# Patient Record
Sex: Female | Born: 1985 | Race: White | Hispanic: Yes | Smoking: Never smoker
Health system: Southern US, Community
[De-identification: ages and names within clinical notes are randomized; demographics above are authoritative.]

## PROBLEM LIST (undated history)

## (undated) DIAGNOSIS — O24419 Gestational diabetes mellitus in pregnancy, unspecified control: Secondary | ICD-10-CM

## (undated) DIAGNOSIS — E039 Hypothyroidism, unspecified: Secondary | ICD-10-CM

## (undated) HISTORY — DX: Hypothyroidism, unspecified: E03.9

## (undated) HISTORY — PX: NO PAST SURGERIES: SHX2092

---
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
## 2004-04-06 ENCOUNTER — Ambulatory Visit: Payer: Self-pay | Admitting: Obstetrics and Gynecology

## 2004-04-08 ENCOUNTER — Inpatient Hospital Stay (HOSPITAL_COMMUNITY): Admission: AD | Admit: 2004-04-08 | Discharge: 2004-04-10 | Payer: Self-pay | Admitting: *Deleted

## 2004-04-08 ENCOUNTER — Ambulatory Visit: Payer: Self-pay | Admitting: Obstetrics and Gynecology

## 2004-08-24 ENCOUNTER — Inpatient Hospital Stay (HOSPITAL_COMMUNITY): Admission: AD | Admit: 2004-08-24 | Discharge: 2004-08-24 | Payer: Self-pay | Admitting: *Deleted

## 2004-08-26 ENCOUNTER — Inpatient Hospital Stay (HOSPITAL_COMMUNITY): Admission: AD | Admit: 2004-08-26 | Discharge: 2004-08-26 | Payer: Self-pay | Admitting: Obstetrics and Gynecology

## 2004-08-28 ENCOUNTER — Inpatient Hospital Stay (HOSPITAL_COMMUNITY): Admission: AD | Admit: 2004-08-28 | Discharge: 2004-08-28 | Payer: Self-pay | Admitting: Obstetrics & Gynecology

## 2006-01-14 ENCOUNTER — Encounter (INDEPENDENT_AMBULATORY_CARE_PROVIDER_SITE_OTHER): Payer: Self-pay | Admitting: *Deleted

## 2006-01-21 ENCOUNTER — Ambulatory Visit: Payer: Self-pay | Admitting: Family Medicine

## 2006-01-28 ENCOUNTER — Ambulatory Visit: Payer: Self-pay | Admitting: Family Medicine

## 2006-01-31 ENCOUNTER — Ambulatory Visit (HOSPITAL_COMMUNITY): Admission: RE | Admit: 2006-01-31 | Discharge: 2006-01-31 | Payer: Self-pay | Admitting: Family Medicine

## 2006-03-04 ENCOUNTER — Ambulatory Visit: Payer: Self-pay | Admitting: Family Medicine

## 2006-03-16 ENCOUNTER — Ambulatory Visit: Payer: Self-pay | Admitting: Family Medicine

## 2006-03-30 ENCOUNTER — Ambulatory Visit: Payer: Self-pay | Admitting: Family Medicine

## 2006-04-13 ENCOUNTER — Ambulatory Visit: Payer: Self-pay | Admitting: Family Medicine

## 2006-05-25 ENCOUNTER — Encounter: Payer: Self-pay | Admitting: Family Medicine

## 2006-05-25 ENCOUNTER — Ambulatory Visit: Payer: Self-pay | Admitting: Sports Medicine

## 2006-05-25 LAB — CONVERTED CEMR LAB: Chlamydia, DNA Probe: NEGATIVE

## 2006-06-03 ENCOUNTER — Ambulatory Visit: Payer: Self-pay | Admitting: Family Medicine

## 2006-06-10 ENCOUNTER — Ambulatory Visit: Payer: Self-pay | Admitting: Family Medicine

## 2006-06-17 ENCOUNTER — Ambulatory Visit: Payer: Self-pay | Admitting: Family Medicine

## 2006-06-17 ENCOUNTER — Ambulatory Visit: Payer: Self-pay | Admitting: Obstetrics and Gynecology

## 2006-06-17 ENCOUNTER — Inpatient Hospital Stay (HOSPITAL_COMMUNITY): Admission: AD | Admit: 2006-06-17 | Discharge: 2006-06-17 | Payer: Self-pay | Admitting: Obstetrics & Gynecology

## 2006-06-18 ENCOUNTER — Ambulatory Visit: Payer: Self-pay | Admitting: Gynecology

## 2006-06-18 ENCOUNTER — Inpatient Hospital Stay (HOSPITAL_COMMUNITY): Admission: AD | Admit: 2006-06-18 | Discharge: 2006-06-20 | Payer: Self-pay | Admitting: Obstetrics and Gynecology

## 2006-07-08 ENCOUNTER — Encounter (INDEPENDENT_AMBULATORY_CARE_PROVIDER_SITE_OTHER): Payer: Self-pay | Admitting: *Deleted

## 2007-05-07 IMAGING — US US OB COMP +14 WK
1 series · 13 of 28 positions shown · non-contrast
Comparison: none

CLINICAL DATA: Assess dates and fetal anatomy.

[Series 1: us ob comp +14 wk · 0.31mm/px · 81 acquisitions, 13 frames shown]
[im 3/81]
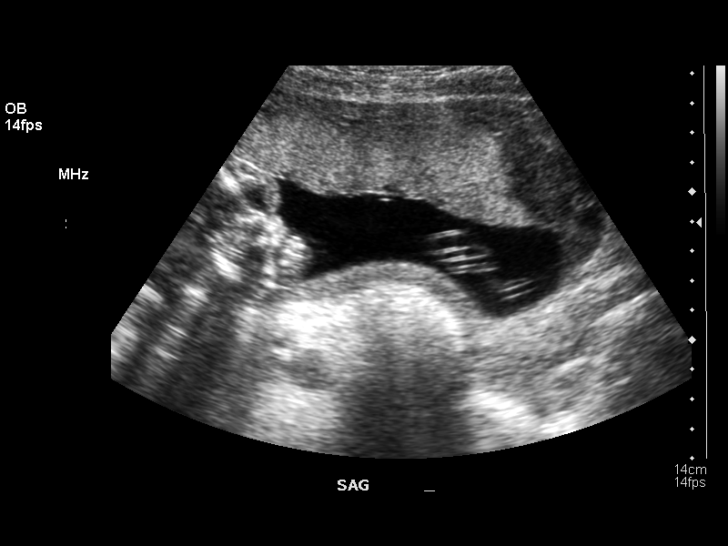
[im 9/81]
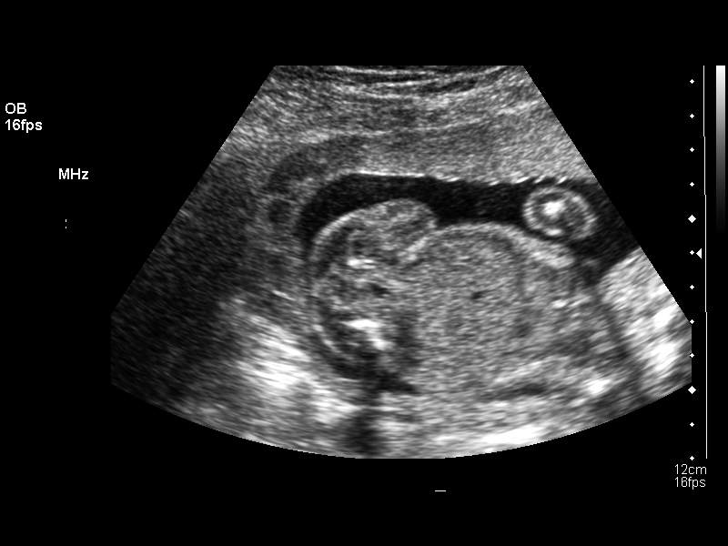
[im 15/81]
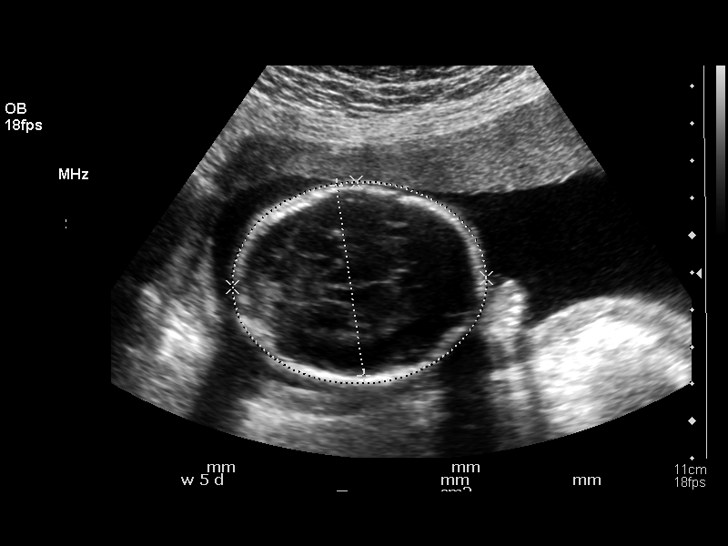
[im 21/81]
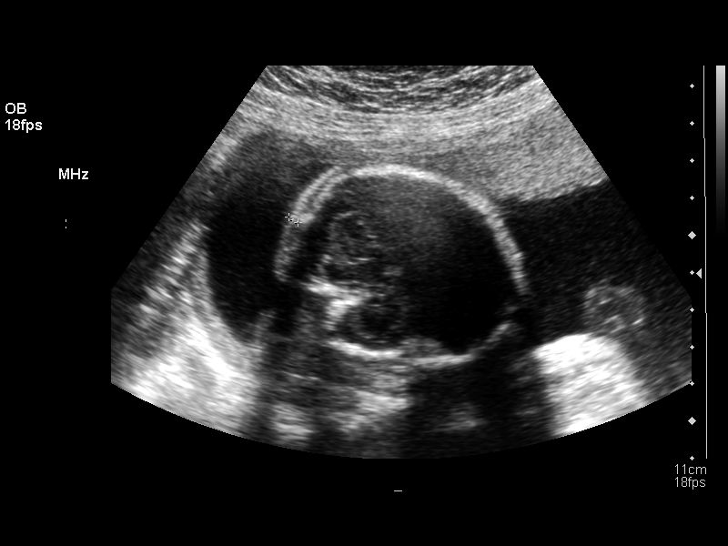
[im 27/81]
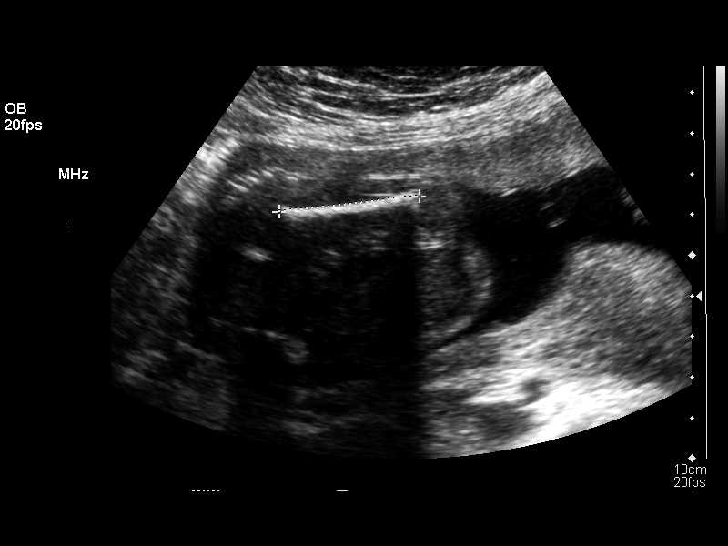
[im 33/81]
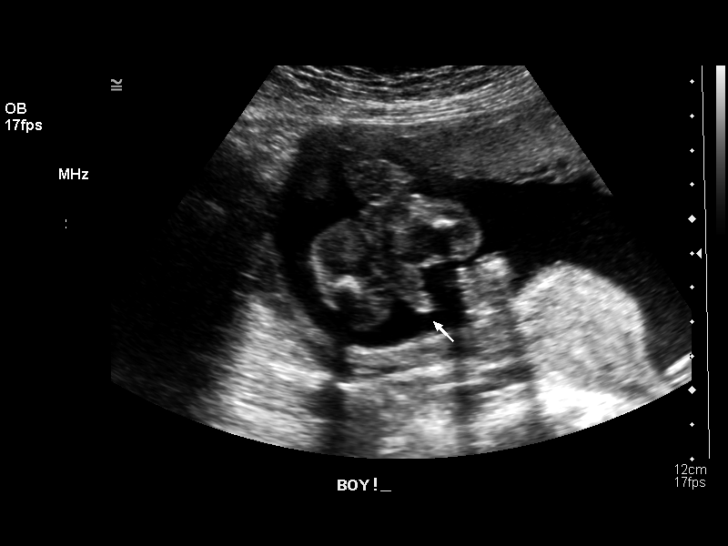
[im 42/81]
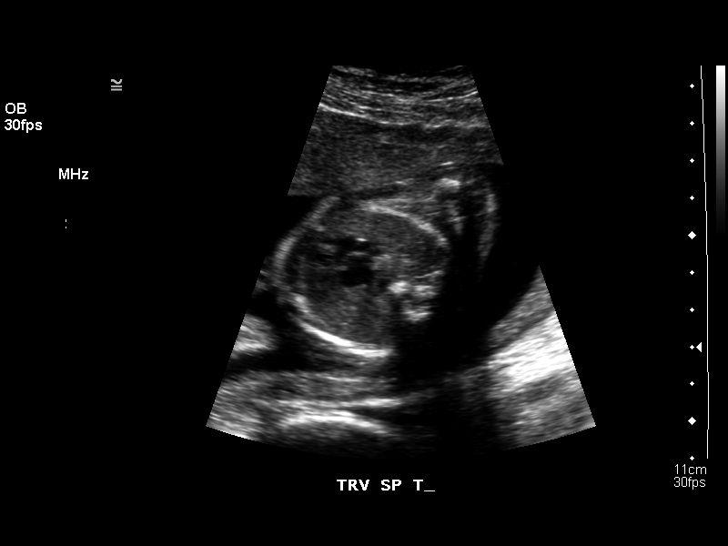
[im 48/81]
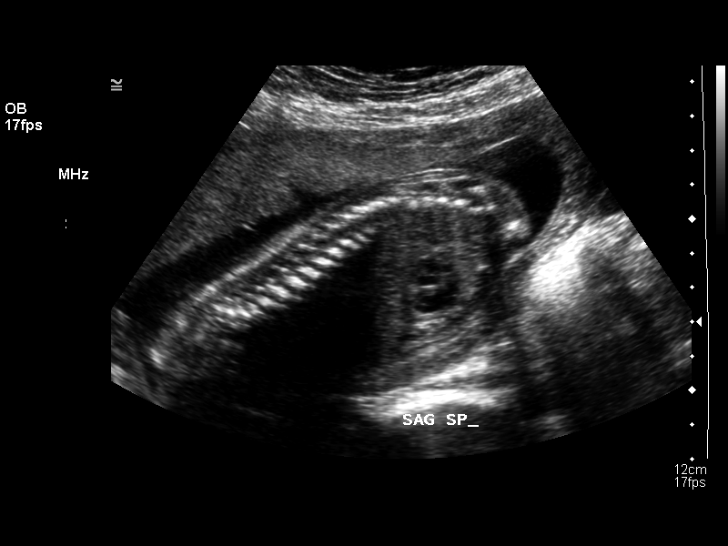
[im 54/81]
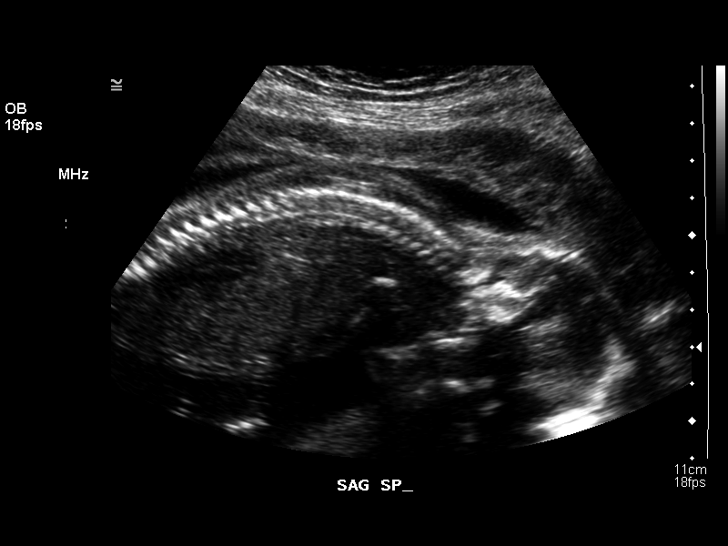
[im 60/81]
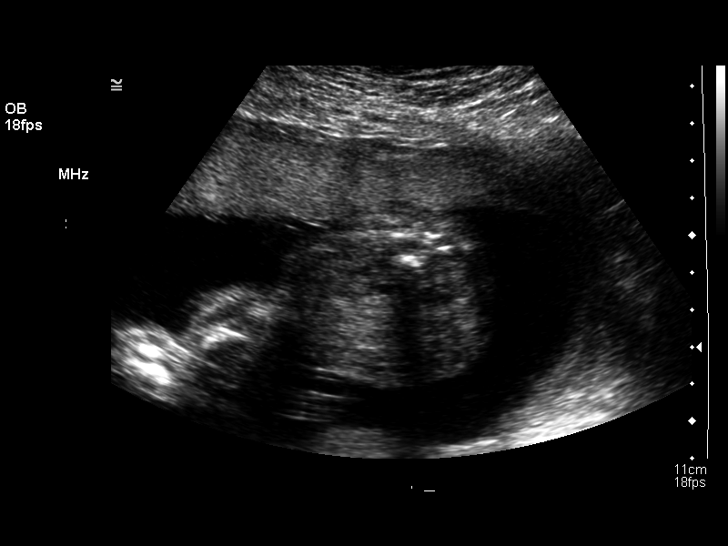
[im 66/81]
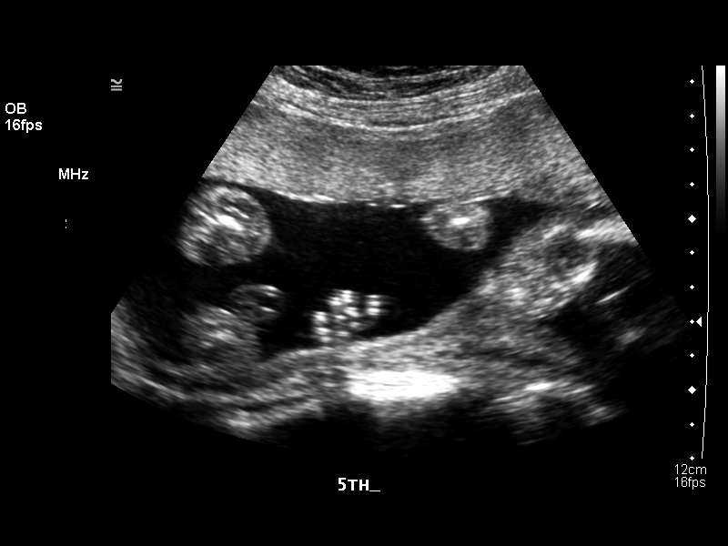
[im 72/81]
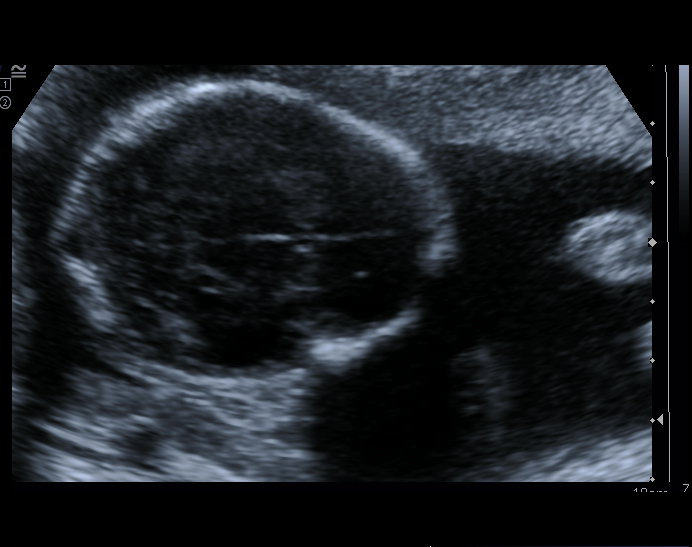
[im 78/81]
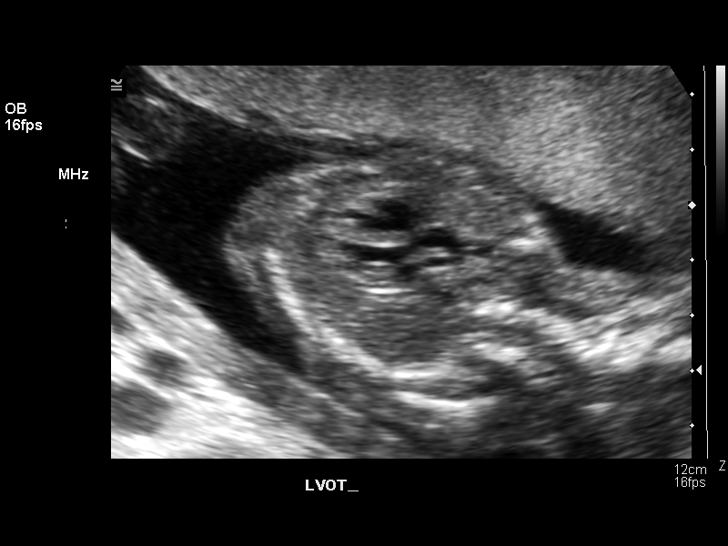

[13 of 28 positions shown; findings below may reference images not displayed]

OBSTETRICAL ULTRASOUND:
 Number of Fetuses:  1
 Heart Rate:  139 bpm
 Movement:  Yes
 Breathing:  No  
 Presentation:  Variable
 Placental Location:  Anterior
 Grade:  I
 Previa:   No
 Amniotic Fluid (Subjective):  Normal
 Amniotic Fluid (Objective):   5.4 cm vertical pocket

 FETAL BIOMETRY
 BPD:  5.2 cm  21 w 5 d 
 HC:  19.2 cm  21 w 3 d
 AC:  16.8 cm  21 w 6 d
 FL:  3.4 cm  20 w 5 d

 MEAN GA:  21 w 3 d   US EDC:  06/10/06  

 FETAL ANATOMY
 Lateral Ventricles:  Visualized 
 Thalami/CSP:  Visualized 
 Posterior Fossa:  Visualized 
 Nuchal Region:  NF= 4.0 mm  Visualized   
 Spine:  Visualized 
 4 Chamber Heart on Left:  Visualized 
 Stomach on Left:  Visualized 
 3 Vessel Cord:  Visualized 
 Cord Insertion site:  Visualized 
 Kidneys:  Visualized 
 Bladder:  Visualized   
 Extremities:  Visualized    

 ADDITIONAL ANATOMY VISUALIZED:  LVOT, RVOT, upper lip, orbits, profile, diaphragm, heel, 5th digit, ductal arch, aortic arch, and nasal bone.

 MATERNAL UTERINE AND ADNEXAL FINDINGS
 Cervix:  3.4 cm transabdominal.
IMPRESSION: 1.  Single intrauterine pregnancy demonstrating an estimated gestational age by ultrasound of 21 weeks 3 days.  This is 3 weeks 4 days ahead of expected estimated gestational age by LMP of 17 weeks 6 days.
 2.  No focal fetal or placental abnormalities are noted with a good anatomic exam possible. 
 3.  Subjectively and quantitatively normal amniotic fluid volume and normal cervical length.

## 2007-09-21 IMAGING — US US FETAL BPP W/O NONSTRESS
1 series · 14 of 18 positions shown · non-contrast
Comparison: none

OBSTETRICAL ULTRASOUND:

 This ultrasound exam was performed in the [HOSPITAL] Ultrasound Department.  The OB US report was generated in the AS system, and faxed to the ordering physician.  This report is also available in [REDACTED] PACS.

[Series 1: us fetal bpp w/o nonstress · non-contrast · 14 of 18 slices shown]
[im 1/18]
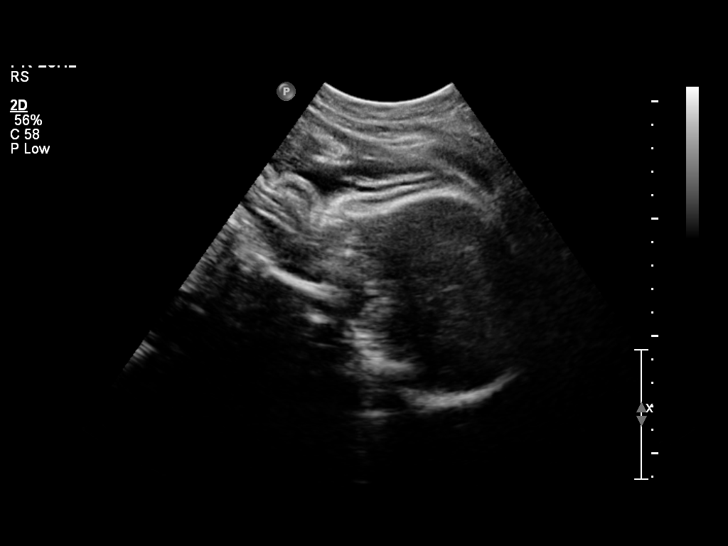
[im 2/18]
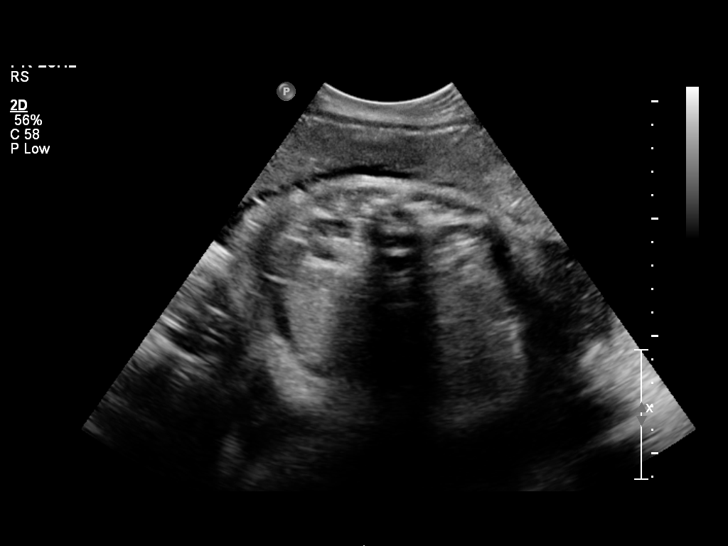
[im 4/18]
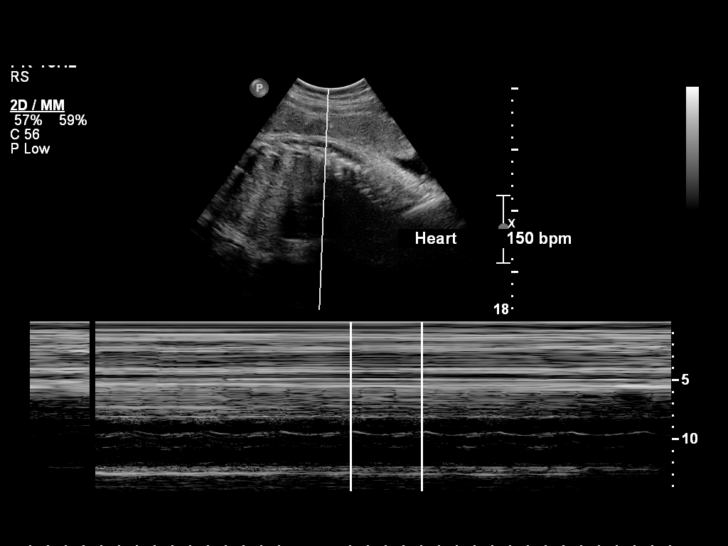
[im 5/18]
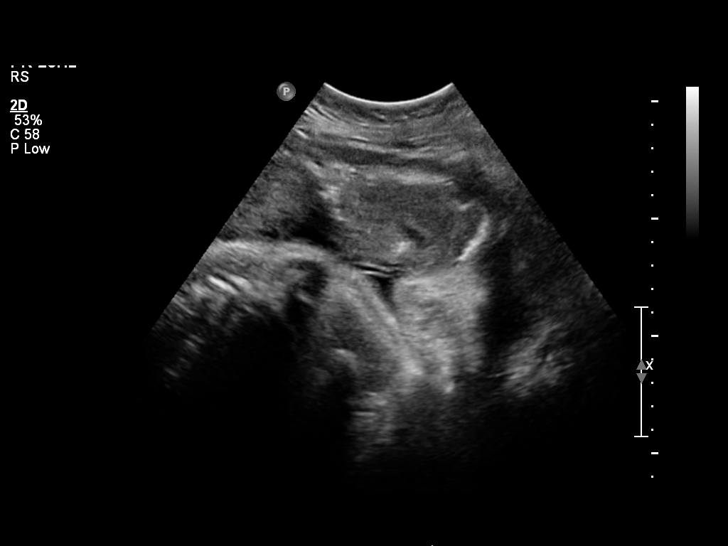
[im 6/18]
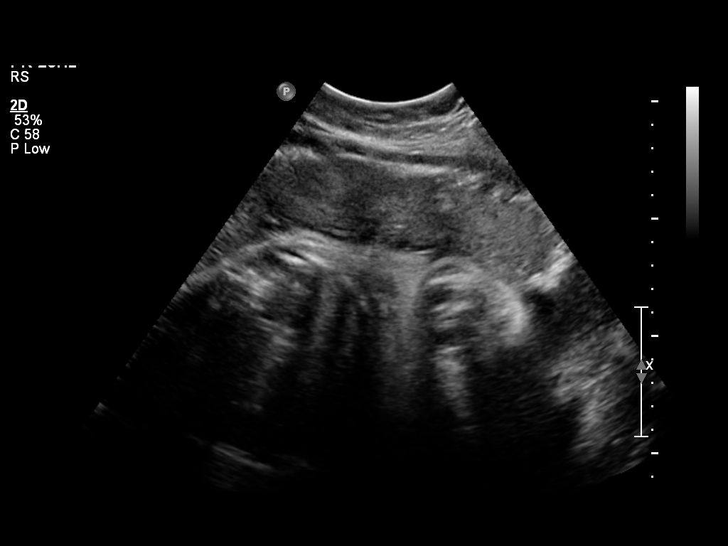
[im 8/18]
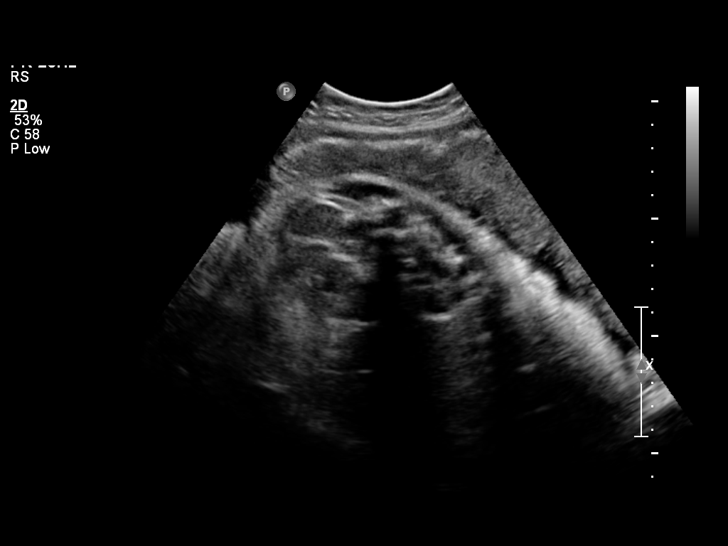
[im 9/18]
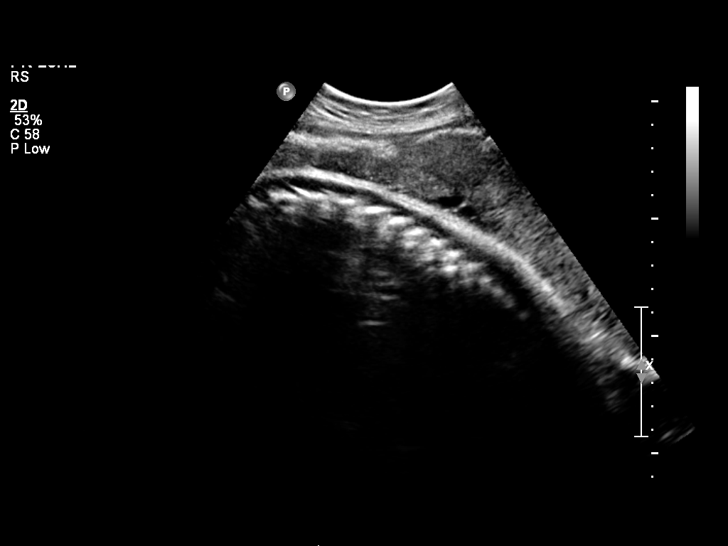
[im 10/18]
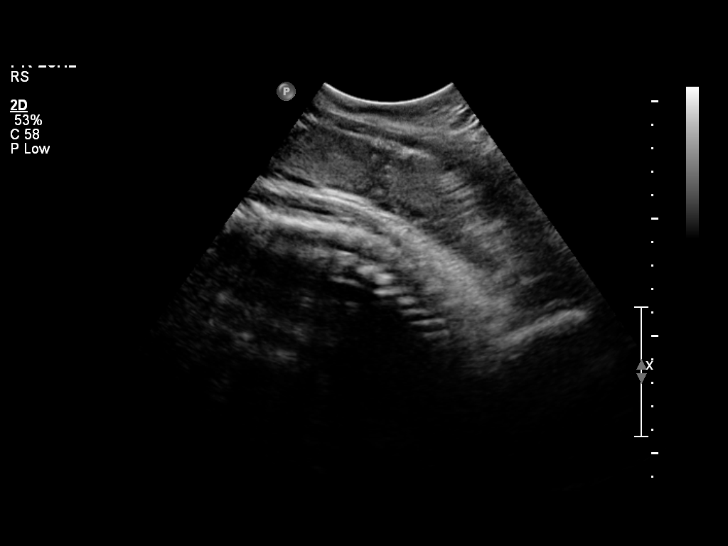
[im 11/18]
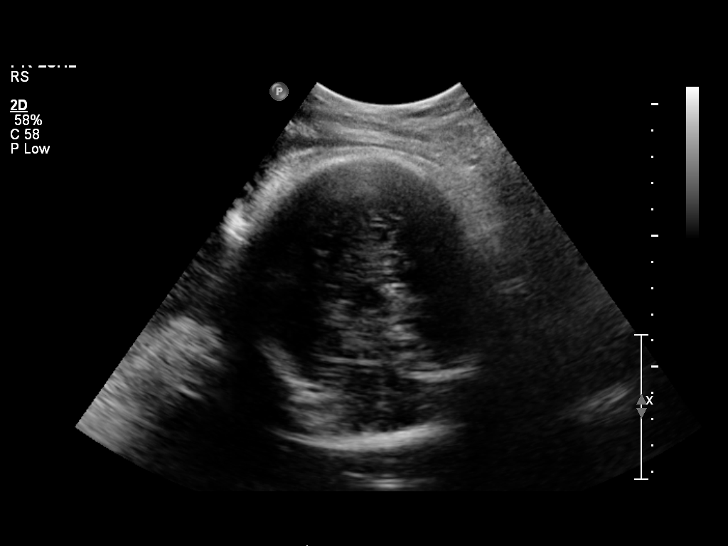
[im 13/18]
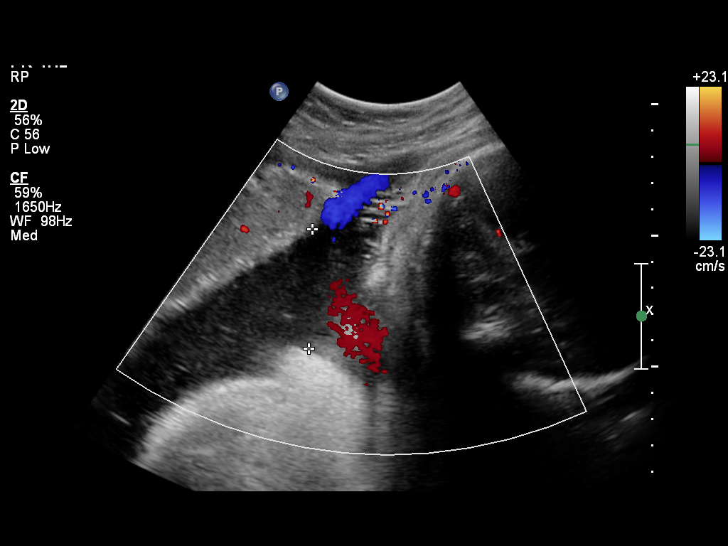
[im 14/18]
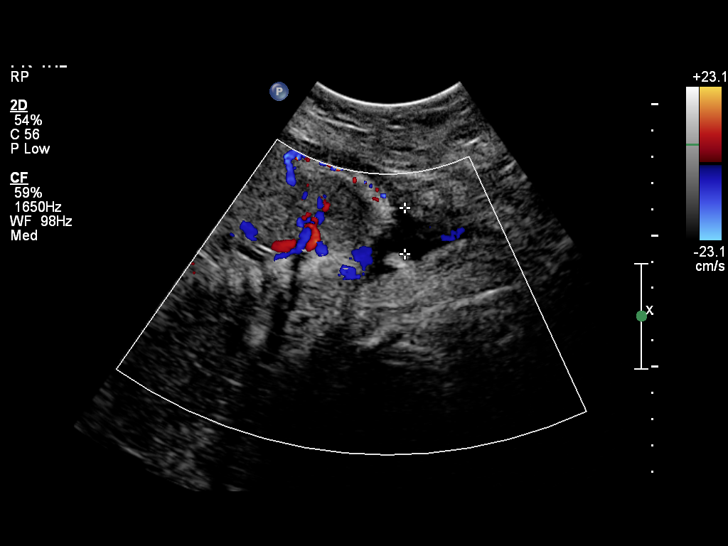
[im 15/18]
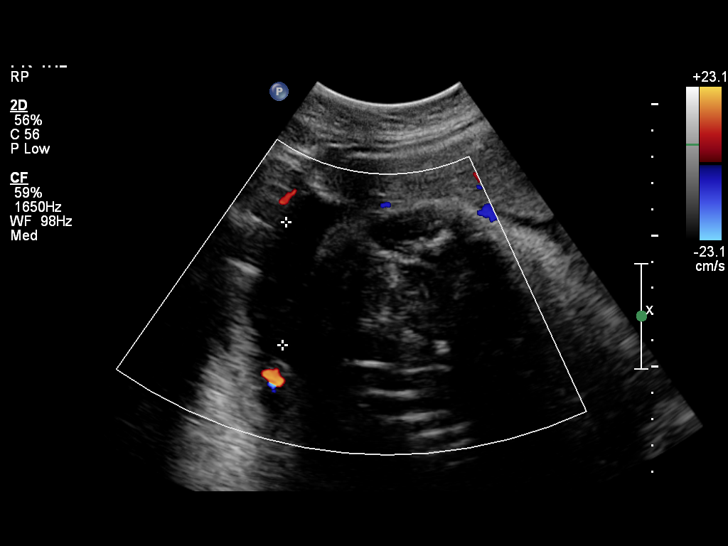
[im 17/18]
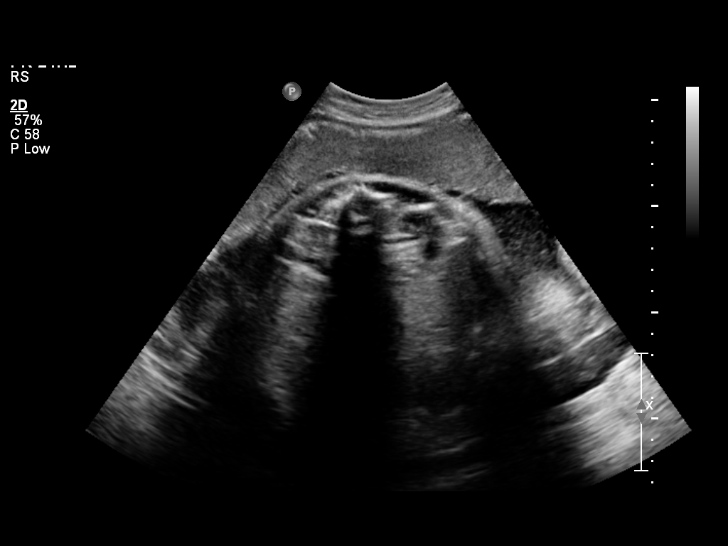
[im 18/18]
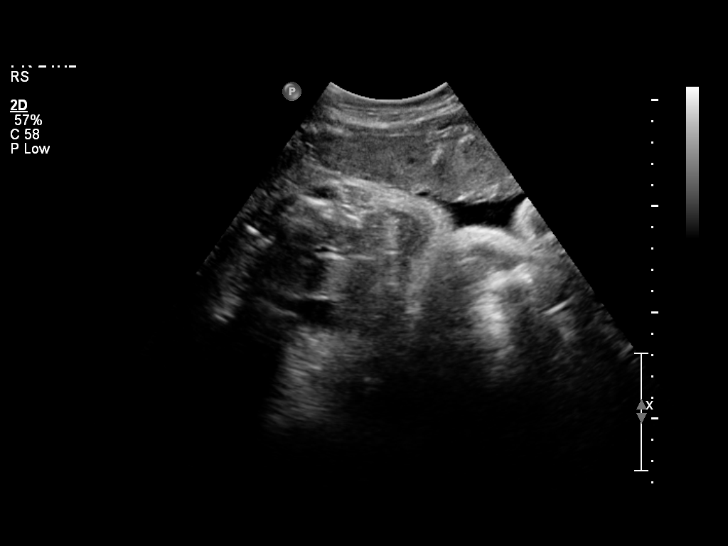

[14 of 18 positions shown; findings below may reference images not displayed]

IMPRESSION: See AS Obstetric US report.

## 2012-12-21 LAB — OB RESULTS CONSOLE ABO/RH: RH TYPE: POSITIVE

## 2012-12-21 LAB — OB RESULTS CONSOLE HEPATITIS B SURFACE ANTIGEN: HEP B S AG: NEGATIVE

## 2012-12-21 LAB — OB RESULTS CONSOLE ANTIBODY SCREEN: Antibody Screen: NEGATIVE

## 2012-12-21 LAB — OB RESULTS CONSOLE GC/CHLAMYDIA
CHLAMYDIA, DNA PROBE: NEGATIVE
Gonorrhea: NEGATIVE

## 2012-12-21 LAB — OB RESULTS CONSOLE RPR: RPR: NONREACTIVE

## 2012-12-21 LAB — PROCEDURE REPORT - SCANNED: PAP SMEAR: NEGATIVE

## 2012-12-21 LAB — OB RESULTS CONSOLE RUBELLA ANTIBODY, IGM: RUBELLA: IMMUNE

## 2012-12-21 LAB — OB RESULTS CONSOLE HIV ANTIBODY (ROUTINE TESTING): HIV: NONREACTIVE

## 2013-05-10 NOTE — L&D Delivery Note (Signed)
Delivery Note At 8:20 AM a viable female was delivered via  (Presentation: ;  ).  APGAR: , ; weight .   Placenta status: , .  Cord:  with the following complications: .  Cord pH: not done  Anesthesia: Epidural  Episiotomy:  Lacerations:  Suture Repair: 2.0 Est. Blood Loss (mL):   Mom to postpartum.  Baby to Couplet care / Skin to Skin.  Evander Macaraeg A 06/22/2013, 8:26 AM

## 2013-05-17 LAB — OB RESULTS CONSOLE GBS: GBS: NEGATIVE

## 2013-05-30 ENCOUNTER — Other Ambulatory Visit (HOSPITAL_COMMUNITY): Payer: Self-pay | Admitting: Obstetrics

## 2013-05-30 ENCOUNTER — Other Ambulatory Visit (HOSPITAL_COMMUNITY): Payer: Self-pay

## 2013-05-30 DIAGNOSIS — O4440 Low lying placenta NOS or without hemorrhage, unspecified trimester: Secondary | ICD-10-CM

## 2013-05-31 ENCOUNTER — Ambulatory Visit (HOSPITAL_COMMUNITY)
Admission: RE | Admit: 2013-05-31 | Discharge: 2013-05-31 | Disposition: A | Discharge status: Home / Self Care | Payer: Medicaid Other | Source: Ambulatory Visit | Attending: Obstetrics | Admitting: Obstetrics

## 2013-05-31 ENCOUNTER — Encounter (HOSPITAL_COMMUNITY): Payer: Self-pay

## 2013-05-31 DIAGNOSIS — O09299 Supervision of pregnancy with other poor reproductive or obstetric history, unspecified trimester: Secondary | ICD-10-CM | POA: Insufficient documentation

## 2013-05-31 DIAGNOSIS — O4440 Low lying placenta NOS or without hemorrhage, unspecified trimester: Secondary | ICD-10-CM

## 2013-05-31 DIAGNOSIS — O4400 Placenta previa specified as without hemorrhage, unspecified trimester: Secondary | ICD-10-CM | POA: Insufficient documentation

## 2013-05-31 DIAGNOSIS — Z363 Encounter for antenatal screening for malformations: Secondary | ICD-10-CM | POA: Insufficient documentation

## 2013-05-31 DIAGNOSIS — O358XX0 Maternal care for other (suspected) fetal abnormality and damage, not applicable or unspecified: Secondary | ICD-10-CM | POA: Insufficient documentation

## 2013-05-31 DIAGNOSIS — Z1389 Encounter for screening for other disorder: Secondary | ICD-10-CM | POA: Insufficient documentation

## 2013-06-22 ENCOUNTER — Encounter (HOSPITAL_COMMUNITY): Payer: Medicaid Other | Admitting: Anesthesiology

## 2013-06-22 ENCOUNTER — Inpatient Hospital Stay (HOSPITAL_COMMUNITY)
Admission: AD | Admit: 2013-06-22 | Discharge: 2013-06-23 | DRG: 775 | Disposition: A | Discharge status: Home / Self Care | Payer: Medicaid Other | Source: Ambulatory Visit | Attending: Obstetrics | Admitting: Obstetrics

## 2013-06-22 ENCOUNTER — Inpatient Hospital Stay (HOSPITAL_COMMUNITY): Payer: Medicaid Other | Admitting: Anesthesiology

## 2013-06-22 ENCOUNTER — Encounter (HOSPITAL_COMMUNITY): Payer: Self-pay

## 2013-06-22 DIAGNOSIS — IMO0001 Reserved for inherently not codable concepts without codable children: Secondary | ICD-10-CM

## 2013-06-22 LAB — CBC
HEMATOCRIT: 32.6 % — AB (ref 36.0–46.0)
Hemoglobin: 11 g/dL — ABNORMAL LOW (ref 12.0–15.0)
MCH: 26.8 pg (ref 26.0–34.0)
MCHC: 33.7 g/dL (ref 30.0–36.0)
MCV: 79.5 fL (ref 78.0–100.0)
PLATELETS: 216 10*3/uL (ref 150–400)
RBC: 4.1 MIL/uL (ref 3.87–5.11)
RDW: 14.1 % (ref 11.5–15.5)
WBC: 10 10*3/uL (ref 4.0–10.5)

## 2013-06-22 LAB — RPR: RPR: NONREACTIVE

## 2013-06-22 LAB — TYPE AND SCREEN
ABO/RH(D): O POS
Antibody Screen: NEGATIVE

## 2013-06-22 LAB — ABO/RH: ABO/RH(D): O POS

## 2013-06-22 MED ORDER — ONDANSETRON HCL 4 MG PO TABS: 4.0000 mg | ORAL_TABLET | ORAL | Status: DC | PRN

## 2013-06-22 MED ORDER — FERROUS SULFATE 325 (65 FE) MG PO TABS
325.0000 mg | ORAL_TABLET | Freq: Two times a day (BID) | ORAL | Status: DC
  Administered 2013-06-22 – 2013-06-23 (×3): 325 mg via ORAL
  Filled 2013-06-22 (×3): qty 1

## 2013-06-22 MED ORDER — SENNOSIDES-DOCUSATE SODIUM 8.6-50 MG PO TABS
2.0000 | ORAL_TABLET | ORAL | Status: DC
  Administered 2013-06-22: 2 via ORAL
  Filled 2013-06-22: qty 2

## 2013-06-22 MED ORDER — BENZOCAINE-MENTHOL 20-0.5 % EX AERO
1.0000 "application " | INHALATION_SPRAY | CUTANEOUS | Status: DC | PRN
  Administered 2013-06-22: 1 via TOPICAL
  Filled 2013-06-22: qty 56

## 2013-06-22 MED ORDER — ONDANSETRON HCL 4 MG/2ML IJ SOLN
4.0000 mg | Freq: Four times a day (QID) | INTRAMUSCULAR | Status: DC | PRN
  Filled 2013-06-22: qty 2

## 2013-06-22 MED ORDER — OXYCODONE-ACETAMINOPHEN 5-325 MG PO TABS: 1.0000 | ORAL_TABLET | ORAL | Status: DC | PRN

## 2013-06-22 MED ORDER — SODIUM BICARBONATE 8.4 % IV SOLN
INTRAVENOUS | Status: DC | PRN
  Administered 2013-06-22: 5 mL via EPIDURAL

## 2013-06-22 MED ORDER — ONDANSETRON HCL 4 MG/2ML IJ SOLN: 4.0000 mg | INTRAMUSCULAR | Status: DC | PRN

## 2013-06-22 MED ORDER — DIBUCAINE 1 % RE OINT: 1.0000 "application " | TOPICAL_OINTMENT | RECTAL | Status: DC | PRN

## 2013-06-22 MED ORDER — ZOLPIDEM TARTRATE 5 MG PO TABS: 5.0000 mg | ORAL_TABLET | Freq: Every evening | ORAL | Status: DC | PRN

## 2013-06-22 MED ORDER — PRENATAL MULTIVITAMIN CH
1.0000 | ORAL_TABLET | Freq: Every day | ORAL | Status: DC
  Administered 2013-06-22 – 2013-06-23 (×2): 1 via ORAL
  Filled 2013-06-22 (×2): qty 1

## 2013-06-22 MED ORDER — LANOLIN HYDROUS EX OINT: TOPICAL_OINTMENT | CUTANEOUS | Status: DC | PRN

## 2013-06-22 MED ORDER — INFLUENZA VAC SPLIT QUAD 0.5 ML IM SUSP
0.5000 mL | INTRAMUSCULAR | Status: AC
  Administered 2013-06-23: 0.5 mL via INTRAMUSCULAR

## 2013-06-22 MED ORDER — PRENATAL 27-0.8 MG PO TABS: 1.0000 | ORAL_TABLET | Freq: Every day | ORAL | Status: DC

## 2013-06-22 MED ORDER — TETANUS-DIPHTH-ACELL PERTUSSIS 5-2.5-18.5 LF-MCG/0.5 IM SUSP
0.5000 mL | Freq: Once | INTRAMUSCULAR | Status: AC
  Administered 2013-06-22: 0.5 mL via INTRAMUSCULAR
  Filled 2013-06-22: qty 0.5

## 2013-06-22 MED ORDER — PHENYLEPHRINE 40 MCG/ML (10ML) SYRINGE FOR IV PUSH (FOR BLOOD PRESSURE SUPPORT)
80.0000 ug | PREFILLED_SYRINGE | INTRAVENOUS | Status: DC | PRN
Start: 2013-06-22 — End: 2013-06-22
  Filled 2013-06-22: qty 10

## 2013-06-22 MED ORDER — EPHEDRINE 5 MG/ML INJ
10.0000 mg | INTRAVENOUS | Status: DC | PRN
  Administered 2013-06-22: 10 mg via INTRAVENOUS

## 2013-06-22 MED ORDER — LIDOCAINE HCL (PF) 1 % IJ SOLN
30.0000 mL | INTRAMUSCULAR | Status: DC | PRN
  Filled 2013-06-22: qty 30

## 2013-06-22 MED ORDER — METHYLERGONOVINE MALEATE 0.2 MG/ML IJ SOLN
INTRAMUSCULAR | Status: AC
  Filled 2013-06-22: qty 1

## 2013-06-22 MED ORDER — EPHEDRINE 5 MG/ML INJ
10.0000 mg | INTRAVENOUS | Status: DC | PRN
  Filled 2013-06-22: qty 4

## 2013-06-22 MED ORDER — OXYTOCIN 40 UNITS IN LACTATED RINGERS INFUSION - SIMPLE MED
62.5000 mL/h | INTRAVENOUS | Status: DC
  Administered 2013-06-22: 999 mL/h via INTRAVENOUS
  Filled 2013-06-22: qty 1000

## 2013-06-22 MED ORDER — LACTATED RINGERS IV SOLN: 500.0000 mL | Freq: Once | INTRAVENOUS | Status: DC

## 2013-06-22 MED ORDER — SIMETHICONE 80 MG PO CHEW: 80.0000 mg | CHEWABLE_TABLET | ORAL | Status: DC | PRN

## 2013-06-22 MED ORDER — DIPHENHYDRAMINE HCL 50 MG/ML IJ SOLN: 12.5000 mg | INTRAMUSCULAR | Status: DC | PRN

## 2013-06-22 MED ORDER — DIPHENHYDRAMINE HCL 25 MG PO CAPS: 25.0000 mg | ORAL_CAPSULE | Freq: Four times a day (QID) | ORAL | Status: DC | PRN

## 2013-06-22 MED ORDER — PHENYLEPHRINE 40 MCG/ML (10ML) SYRINGE FOR IV PUSH (FOR BLOOD PRESSURE SUPPORT): 80.0000 ug | PREFILLED_SYRINGE | INTRAVENOUS | Status: DC | PRN

## 2013-06-22 MED ORDER — METHYLERGONOVINE MALEATE 0.2 MG/ML IJ SOLN: 0.2000 mg | Freq: Once | INTRAMUSCULAR | Status: DC

## 2013-06-22 MED ORDER — OXYTOCIN BOLUS FROM INFUSION
500.0000 mL | INTRAVENOUS | Status: DC
Start: 2013-06-22 — End: 2013-06-22
  Administered 2013-06-22: 500 mL via INTRAVENOUS

## 2013-06-22 MED ORDER — LACTATED RINGERS IV SOLN
500.0000 mL | INTRAVENOUS | Status: DC | PRN
  Administered 2013-06-22: 300 mL via INTRAVENOUS

## 2013-06-22 MED ORDER — FLEET ENEMA 7-19 GM/118ML RE ENEM: 1.0000 | ENEMA | RECTAL | Status: DC | PRN

## 2013-06-22 MED ORDER — IBUPROFEN 600 MG PO TABS
600.0000 mg | ORAL_TABLET | Freq: Four times a day (QID) | ORAL | Status: DC
Start: 2013-06-22 — End: 2013-06-23
  Administered 2013-06-22 – 2013-06-23 (×4): 600 mg via ORAL
  Filled 2013-06-22 (×6): qty 1

## 2013-06-22 MED ORDER — IBUPROFEN 600 MG PO TABS: 600.0000 mg | ORAL_TABLET | Freq: Four times a day (QID) | ORAL | Status: DC | PRN

## 2013-06-22 MED ORDER — CITRIC ACID-SODIUM CITRATE 334-500 MG/5ML PO SOLN: 30.0000 mL | ORAL | Status: DC | PRN

## 2013-06-22 MED ORDER — ONDANSETRON HCL 4 MG/2ML IJ SOLN: 4.0000 mg | Freq: Four times a day (QID) | INTRAMUSCULAR | Status: DC | PRN

## 2013-06-22 MED ORDER — WITCH HAZEL-GLYCERIN EX PADS: 1.0000 "application " | MEDICATED_PAD | CUTANEOUS | Status: DC | PRN

## 2013-06-22 MED ORDER — FENTANYL 2.5 MCG/ML BUPIVACAINE 1/10 % EPIDURAL INFUSION (WH - ANES)
14.0000 mL/h | INTRAMUSCULAR | Status: DC | PRN
  Administered 2013-06-22: 14 mL/h via EPIDURAL
  Filled 2013-06-22: qty 125

## 2013-06-22 MED ORDER — ACETAMINOPHEN 325 MG PO TABS: 650.0000 mg | ORAL_TABLET | ORAL | Status: DC | PRN

## 2013-06-22 MED ORDER — LACTATED RINGERS IV SOLN
INTRAVENOUS | Status: DC
  Administered 2013-06-22 (×2): 125 mL/h via INTRAVENOUS

## 2013-06-22 MED ORDER — METHYLERGONOVINE MALEATE 0.2 MG PO TABS
0.2000 mg | ORAL_TABLET | Freq: Four times a day (QID) | ORAL | Status: DC
  Administered 2013-06-22 – 2013-06-23 (×5): 0.2 mg via ORAL
  Filled 2013-06-22 (×5): qty 1

## 2013-06-22 NOTE — Anesthesia Postprocedure Evaluation (Signed)
  Anesthesia Post-op Note  Anesthesia Post Note  Anesthesia Post Note  Patient: Debra Eaton  Procedure(s) Performed: * No procedures listed *  Anesthesia type: Epidural  Patient location: Mother/Baby  Post pain: Pain level controlled  Post assessment: Post-op Vital signs reviewed  Last Vitals:  Filed Vitals:   06/22/13 1210  BP: 108/66  Pulse: 74  Temp: 36.6 C  Resp: 18    Post vital signs: Reviewed  Level of consciousness:alert  Complications: No apparent anesthesia complications

## 2013-06-22 NOTE — MAU Note (Signed)
Pt c/o uc q 5-10 mins. Denies LOF or vag bleeding. +FM

## 2013-06-22 NOTE — Progress Notes (Signed)
E Royal interpreter here for introduction. Pt and sister without complaints

## 2013-06-22 NOTE — H&P (Signed)
This is Dr. Francoise CeoBernard Ardie Mclennan dictating the history and physical on  Debra Eaton  she's a 28 year old gravida 5 para 3013 at 40 weeks and 5 days EDC 28 negative GBS admitted in labor she is now 8 cm 100% vertex minus station amniotomy performed the fluids clear Past medical history negative Past surgical history negative Social history noncontributory and you System review negative physical exam well-developed female in no distress HEENT negative lungs clear to P&A heart regular rhythm no murmurs no gallops Breasts negative Abdomen term Pelvic as described above Extremities negative

## 2013-06-22 NOTE — Plan of Care (Signed)
Problem: Phase I Progression Outcomes Goal: Initial discharge plan identified Outcome: Completed/Met Date Met:  06/22/13 Mom requested early discharge after 24 hrs.

## 2013-06-22 NOTE — Anesthesia Procedure Notes (Signed)

## 2013-06-22 NOTE — Progress Notes (Signed)
UR completed 

## 2013-06-22 NOTE — Lactation Note (Signed)
This note was copied from the chart of Girl Winna Bernal-Lagarda. Lactation Consultation Note Initial consult:  Mother speaks english but primary language is spanish, provided [employer] lacation brochure.  Mother states breastfeeding going well and has been taught hand expression and has colostrum.  Encouraged mother to call if assistance is needed.  Reviewed basics, feeding 8-12 times a day, supply and demand and lactation services.   Patient Name: Girl Caren Macadamlejandra Bernal-Lagarda [id number]'s Date: 06/22/2013 Reason for consult: Initial assessment   Maternal Data Has patient been taught Hand Expression?: Yes Does the patient have breastfeeding experience prior to this delivery?: Yes  Feeding Feeding Type: Breast Fed Length of feed: 40 min  LATCH Score/Interventions Latch: Repeated attempts needed to sustain latch, nipple held in mouth throughout feeding, stimulation needed to elicit sucking reflex. Intervention(s): Waking techniques;Teach feeding cues;Skin to skin Intervention(s): Adjust position;Assist with latch;Breast massage;Breast compression  Audible Swallowing: None Intervention(s): Skin to skin;Hand expression  Type of Nipple: Everted at rest and after stimulation  Comfort (Breast/Nipple): Soft / non-tender     Hold (Positioning): Assistance needed to correctly position infant at breast and maintain latch.  LATCH Score: 6  Lactation Tools Discussed/Used Date initiated:: 06/23/13   Consult Status Consult Status: Follow-up Date: 06/23/13 Follow-up type: In-patient    Dahlia ByesBerkelhammer, [facility] Center[facility] 06/22/2013, 10:31 PM

## 2013-06-22 NOTE — Anesthesia Preprocedure Evaluation (Signed)

## 2013-06-23 LAB — CBC
HCT: 31.3 % — ABNORMAL LOW (ref 36.0–46.0)
Hemoglobin: 10.5 g/dL — ABNORMAL LOW (ref 12.0–15.0)
MCH: 26.9 pg (ref 26.0–34.0)
MCHC: 33.5 g/dL (ref 30.0–36.0)
MCV: 80.1 fL (ref 78.0–100.0)
Platelets: 198 10*3/uL (ref 150–400)
RBC: 3.91 MIL/uL (ref 3.87–5.11)
RDW: 14.2 % (ref 11.5–15.5)
WBC: 11.3 10*3/uL — AB (ref 4.0–10.5)

## 2013-06-23 NOTE — Lactation Note (Signed)
This note was copied from the chart of Debra Jay Eaton. Lactation Consultation Note  Patient Name: Debra Eaton ZOXWR'UToday's Date: 06/23/2013 Reason for consult: Follow-up assessment Per mom nipples are sore , LC assessed , pinky red , Reviewed basics - breast massage , hand express,  Pre- pump if needed to make the nipple and areola more elastic for a deeper latch . Baby hungry , Lc assisted with latch  And worked on depth , using the breast compression technique and per mom more comfortable.  Instructed on breast shells , comfort gels , and hand pump, sore nipple and engorgement prevent and tx. Mom aware of the [facility] and [facility].     Maternal Data Formula Feeding for Exclusion: Yes Reason for exclusion: Mother's choice to formula and breast feed on admission Has patient been taught Hand Expression?: Yes  Feeding Feeding Type: Breast Fed Length of feed:  (has been feeding 15 mins and still latched @0945 )  LATCH Score/Interventions Latch: Grasps breast easily, tongue down, lips flanged, rhythmical sucking. Intervention(s): Skin to skin;Teach feeding cues;Waking techniques Intervention(s): Assist with latch;Adjust position;Breast massage;Breast compression  Audible Swallowing: Spontaneous and intermittent  Type of Nipple: Everted at rest and after stimulation  Comfort (Breast/Nipple): Filling, red/small blisters or bruises, mild/mod discomfort  Problem noted: Filling  Hold (Positioning): Assistance needed to correctly position infant at breast and maintain latch. Intervention(s): Breastfeeding basics reviewed;Support Pillows;Position options;Skin to skin  LATCH Score: 8  Lactation Tools Discussed/Used Tools: Pump;Comfort gels;Shells Breast pump type: Manual WIC Program: Yes Pump Review: Setup, frequency, and cleaning;Milk Storage Initiated by:: MAI  Date initiated:: 06/23/13   Consult Status Consult Status: Complete    Kathrin Greathouseorio,  Maxine Huynh Ann 06/23/2013, 9:48 AM

## 2013-06-23 NOTE — Discharge Summary (Signed)
Obstetric Discharge Summary Reason for Admission: onset of labor Prenatal Procedures: none Intrapartum Procedures: spontaneous vaginal delivery Postpartum Procedures: none Complications-Operative and Postpartum: none Hemoglobin  Date Value Ref Range Status  06/22/2013 11.0* 12.0 - 15.0 g/dL Final     HCT  Date Value Ref Range Status  06/22/2013 32.6* 36.0 - 46.0 % Final    Physical Exam:  General: alert Lochia: appropriate Uterine Fundus: firm Incision: healing well DVT Evaluation: No evidence of DVT seen on physical exam.  Discharge Diagnoses: Term Pregnancy-delivered  Discharge Information: Date: 06/23/2013 Activity: pelvic rest Diet: routine Medications: Percocet Condition: stable Instructions: refer to practice specific booklet Discharge to: home Follow-up Information   Follow up with Kathreen CosierMARSHALL,Trayce Caravello A, MD.   Specialty:  Obstetrics and Gynecology   Contact information:   [address] [place] [address] [phone]       Newborn Data: Live born female  Birth Weight: 7 lb 15.9 oz (3625 g) APGAR: 9, 10  Home with mother.  Debra Eaton A 06/23/2013, 6:24 AM

## 2013-06-23 NOTE — Progress Notes (Signed)
Patient ID: Debra Eaton, female   DOB: [birth year], 28 y.o.   MRN: [id number] Postpartum day one Vital signs normal Fundus firm Lochia moderate 1 started discharge

## 2013-06-23 NOTE — Discharge Instructions (Signed)
Discharge instructions ° °· You can wash your hair °· Shower °· Eat what you want °· Drink what you want °· See me in 6 weeks °· Your ankles are going to swell more in the next 2 weeks than when pregnant °· No sex for 6 weeks ° ° °Debra Eaton A, MD 06/23/2013 ° ° °

## 2014-03-11 ENCOUNTER — Encounter (HOSPITAL_COMMUNITY): Payer: Self-pay

## 2016-02-01 ENCOUNTER — Encounter: Payer: Self-pay | Admitting: *Deleted

## 2016-04-23 ENCOUNTER — Emergency Department (HOSPITAL_COMMUNITY)
Admission: EM | Admit: 2016-04-23 | Discharge: 2016-04-23 | Discharge status: Against Medical Advice | Payer: Self-pay | Attending: Emergency Medicine | Admitting: Emergency Medicine

## 2016-04-23 ENCOUNTER — Encounter (HOSPITAL_COMMUNITY): Payer: Self-pay | Admitting: Emergency Medicine

## 2016-04-23 ENCOUNTER — Emergency Department (HOSPITAL_COMMUNITY): Payer: Self-pay

## 2016-04-23 DIAGNOSIS — J9801 Acute bronchospasm: Secondary | ICD-10-CM | POA: Insufficient documentation

## 2016-04-23 DIAGNOSIS — R0902 Hypoxemia: Secondary | ICD-10-CM | POA: Insufficient documentation

## 2016-04-23 LAB — CBC WITH DIFFERENTIAL/PLATELET
BASOS PCT: 0 %
Basophils Absolute: 0 10*3/uL (ref 0.0–0.1)
Eosinophils Absolute: 0.8 10*3/uL — ABNORMAL HIGH (ref 0.0–0.7)
Eosinophils Relative: 8 %
HEMATOCRIT: 41.9 % (ref 36.0–46.0)
Hemoglobin: 14.6 g/dL (ref 12.0–15.0)
Lymphocytes Relative: 27 %
Lymphs Abs: 2.6 10*3/uL (ref 0.7–4.0)
MCH: 29.5 pg (ref 26.0–34.0)
MCHC: 34.8 g/dL (ref 30.0–36.0)
MCV: 84.6 fL (ref 78.0–100.0)
MONO ABS: 0.8 10*3/uL (ref 0.1–1.0)
Monocytes Relative: 8 %
NEUTROS ABS: 5.5 10*3/uL (ref 1.7–7.7)
NEUTROS PCT: 57 %
Platelets: 342 10*3/uL (ref 150–400)
RBC: 4.95 MIL/uL (ref 3.87–5.11)
RDW: 13.8 % (ref 11.5–15.5)
WBC: 9.7 10*3/uL (ref 4.0–10.5)

## 2016-04-23 LAB — BASIC METABOLIC PANEL
Anion gap: 9 (ref 5–15)
BUN: 12 mg/dL (ref 6–20)
CALCIUM: 9.2 mg/dL (ref 8.9–10.3)
CO2: 25 mmol/L (ref 22–32)
Chloride: 106 mmol/L (ref 101–111)
Creatinine, Ser: 0.59 mg/dL (ref 0.44–1.00)
Glucose, Bld: 105 mg/dL — ABNORMAL HIGH (ref 65–99)
POTASSIUM: 3.9 mmol/L (ref 3.5–5.1)
Sodium: 140 mmol/L (ref 135–145)

## 2016-04-23 MED ORDER — METHYLPREDNISOLONE SODIUM SUCC 125 MG IJ SOLR
125.0000 mg | Freq: Once | INTRAMUSCULAR | Status: AC
  Administered 2016-04-23: 125 mg via INTRAVENOUS
  Filled 2016-04-23: qty 2

## 2016-04-23 MED ORDER — ALBUTEROL SULFATE HFA 108 (90 BASE) MCG/ACT IN AERS
2.0000 | INHALATION_SPRAY | RESPIRATORY_TRACT | Status: DC | PRN
  Filled 2016-04-23: qty 6.7

## 2016-04-23 MED ORDER — AZITHROMYCIN 250 MG PO TABS: 250.0000 mg | ORAL_TABLET | Freq: Every day | ORAL | 0 refills | Status: DC

## 2016-04-23 MED ORDER — PREDNISONE 20 MG PO TABS: 40.0000 mg | ORAL_TABLET | Freq: Every day | ORAL | 0 refills | Status: DC

## 2016-04-23 MED ORDER — IPRATROPIUM BROMIDE 0.02 % IN SOLN
0.5000 mg | Freq: Once | RESPIRATORY_TRACT | Status: AC
  Administered 2016-04-23: 0.5 mg via RESPIRATORY_TRACT
  Filled 2016-04-23: qty 2.5

## 2016-04-23 MED ORDER — ALBUTEROL (5 MG/ML) CONTINUOUS INHALATION SOLN
10.0000 mg/h | INHALATION_SOLUTION | RESPIRATORY_TRACT | Status: AC
  Administered 2016-04-23: 10 mg/h via RESPIRATORY_TRACT
  Filled 2016-04-23: qty 20

## 2016-04-23 MED ORDER — ALBUTEROL SULFATE (2.5 MG/3ML) 0.083% IN NEBU
5.0000 mg | INHALATION_SOLUTION | Freq: Once | RESPIRATORY_TRACT | Status: AC
  Administered 2016-04-23: 5 mg via RESPIRATORY_TRACT
  Filled 2016-04-23: qty 6

## 2016-04-23 NOTE — ED Notes (Signed)
Pulse ox dropped to 85 while ambulating.

## 2016-04-23 NOTE — Discharge Instructions (Signed)
It is advised that you stay in the hospital due to hypoxia (low oxygen).  You have chosen to leave of your own free will because of your child care circumstances.  You have been discharged with medications that should continue to help your symptoms, but it is strongly advised that you remain in the hospital.  If you choose to go home, it is extremely important that you return to the hospital if you have any worsening symptoms.  If your breathing worsens, please dial 911 and return.  You are welcome back any time.  Please take the medications as prescribed.

## 2016-04-23 NOTE — ED Triage Notes (Signed)
Pt states she has a cough that started 2.5 weeks ago. Pt states she tried taking OTC cough medicine, with no relief. Pt states she has a headache, SOB, and fever. Pt has not been around anyone sick.

## 2016-04-23 NOTE — ED Provider Notes (Signed)
[facility]-EMERGENCY DEPT Provider Note   CSN: [id number] Arrival date & time: 04/23/16  1544     History   Chief Complaint Chief Complaint  Patient presents with  . Cough    HPI Debra Eaton is a 30 y.o. female.  Patient presents emergency department with chief complaint of cough and shortness of breath. She states that the symptoms started about 2 weeks ago. She reports associated fevers and chills. States that feels like her lungs are tight, and that she cannot catch her breath. She denies any history of asthma/COPD. She has not taken anything for symptoms. Symptoms are worsened with activity. There are no other associated symptoms.    The history is provided by the patient. No language interpreter was used.    History reviewed. No pertinent past medical history.  Patient Active Problem List   Diagnosis Date Noted  . Active labor 06/22/2013  . NVD (normal vaginal delivery) 06/22/2013    History reviewed. No pertinent surgical history.  OB History    Gravida Para Term Preterm AB Living   5 4 4  0 1 4   SAB TAB Ectopic Multiple Live Births   0 0 0 0 1       Home Medications    Prior to Admission medications   Not on File    Family History Family History  Problem Relation Age of Onset  . Heart disease Father   . Kidney disease Father     Social History Social History  Substance Use Topics  . Smoking status: Never Smoker  . Smokeless tobacco: Never Used  . Alcohol use Yes     Comment: occasional     Allergies   Patient has no known allergies.   Review of Systems Review of Systems  All other systems reviewed and are negative.    Physical Exam Updated Vital Signs BP 161/94 (BP Location: Left Arm)   Pulse 106   Temp 99.1 F (37.3 C) (Oral)   Resp 18   LMP 04/16/2016 (Approximate)   SpO2 92%   Physical Exam  Constitutional: She is oriented to person, place, and time. She appears well-developed and well-nourished.  HENT:  Head:  Normocephalic and atraumatic.  Eyes: Conjunctivae and EOM are normal. Pupils are equal, round, and reactive to light.  Neck: Normal range of motion. Neck supple.  Cardiovascular: Normal rate and regular rhythm.  Exam reveals no gallop and no friction rub.   No murmur heard. Pulmonary/Chest: No respiratory distress. She has wheezes. She has no rales. She exhibits no tenderness.  Bilateral inspiratory and expiratory wheezes  Abdominal: Soft. Bowel sounds are normal. She exhibits no distension and no mass. There is no tenderness. There is no rebound and no guarding.  Musculoskeletal: Normal range of motion. She exhibits no edema or tenderness.  Neurological: She is alert and oriented to person, place, and time.  Skin: Skin is warm and dry.  Psychiatric: She has a normal mood and affect. Her behavior is normal. Judgment and thought content normal.  Nursing note and vitals reviewed.    ED Treatments / Results  Labs (all labs ordered are listed, but only abnormal results are displayed) Labs Reviewed  CBC WITH DIFFERENTIAL/PLATELET  BASIC METABOLIC PANEL    EKG  EKG Interpretation None       Radiology No results found.  Procedures Procedures (including critical care time)  Medications Ordered in ED Medications  methylPREDNISolone sodium succinate (SOLU-MEDROL) 125 mg/2 mL injection 125 mg (not administered)  albuterol (PROVENTIL) (2.5  MG/3ML) 0.083% nebulizer solution 5 mg (not administered)  ipratropium (ATROVENT) nebulizer solution 0.5 mg (not administered)     Initial Impression / Assessment and Plan / ED Course  I have reviewed the triage vital signs and the nursing notes.  Pertinent labs & imaging results that were available during my care of the patient were reviewed by me and considered in my medical decision making (see chart for details).  Clinical Course     Patient with bilateral inspiratory neck were wheezes, complains of shortness of breath, and cough. Will  check chest x-ray due to reported fever. Will give breathing treatment and Solu-Medrol.   Patient still wheezing after first breathing, treatment, but feels improved.  Will give 2nd breathing treatment.  Patient has received 3 nebulizer treatments including a continuous albuterol treatment. Her breathing has improved, and she states that she is feeling better however she remains markedly wheezy. Additionally, her pulse oxygenation drops to 85% with ambulation. I have strongly advised that the patient be admitted to the hospital for further observation and continued therapy.  CRITICAL CARE Performed by: Roxy HorsemanBROWNING, Cathlin Buchan   Total critical care time: 35 minutes  Critical care time was exclusive of separately billable procedures and treating other patients.  Critical care was necessary to treat or prevent imminent or life-threatening deterioration.  Critical care was time spent personally by me on the following activities: development of treatment plan with patient and/or surrogate as well as nursing, discussions with consultants, evaluation of patient's response to treatment, examination of patient, obtaining history from patient or surrogate, ordering and performing treatments and interventions, ordering and review of laboratory studies, ordering and review of radiographic studies, pulse oximetry and re-evaluation of patient's condition.   Patient states that she cannot stay due to childcare issues. She has capacity to make her own medical decisions. I have answered all of her questions, and have advised her that it is my medical recommendation that she remain in the hospital. She chooses to leave AGAINST MEDICAL ADVICE, and will be discharged with an inhaler, prednisone, and azithromycin. Have strongly urged patient to return to the emergency department if she has any worsening symptoms. She states that she would stay or not for her childcare issue.  Final Clinical Impressions(s) / ED Diagnoses    Final diagnoses:  Bronchospasm  Hypoxia    New Prescriptions New Prescriptions   AZITHROMYCIN (ZITHROMAX) 250 MG TABLET    Take 1 tablet (250 mg total) by mouth daily. Take first 2 tablets together, then 1 every day until finished.   PREDNISONE (DELTASONE) 20 MG TABLET    Take 2 tablets (40 mg total) by mouth daily.     Roxy Horsemanobert Sandhya Denherder, PA-C 04/23/16 2148    Laurence Spatesachel Morgan Little, MD 04/24/16 [phone]

## 2016-04-23 NOTE — ED Notes (Signed)
E-signature not working, pt has verbalized understanding of DC instructions and risks of leaving AMA. Ambulatory

## 2020-05-10 NOTE — L&D Delivery Note (Addendum)
Delivery Note At 5:33 AM a viable female was delivered via Vaginal, Spontaneous (Presentation: Left Occiput Anterior).  APGAR: 9, 9; weight  pending.   Placenta status: Spontaneous, Intact.  Cord: 3 vessels with the following complications: None.  Cord pH: NA Head delivered in usual fashion; anterior shoulder did not deliver immediately  so McRoberts and suprapubic pressure was used; total length of dystocia was 10 seconds. At no time was traction placed on the baby's head.   At time of delivery, patient had large gush of bloody fluid and  was given TXA IV; fundus was firm and she had no more bleeding after IV TXA.  Anesthesia: Epidural Episiotomy: None Lacerations: Left periurethral  Suture Repair:  4-0 SH Est. Blood Loss (mL): 200  Mom to postpartum.  Baby to Couplet care / Skin to Skin.  Charlesetta Garibaldi Cristiana Yochim 04/27/2021, 6:20 AM

## 2020-11-06 ENCOUNTER — Encounter: Payer: Self-pay | Admitting: Obstetrics

## 2020-11-06 DIAGNOSIS — Z3490 Encounter for supervision of normal pregnancy, unspecified, unspecified trimester: Secondary | ICD-10-CM | POA: Insufficient documentation

## 2020-11-14 ENCOUNTER — Other Ambulatory Visit (HOSPITAL_COMMUNITY)
Admission: RE | Admit: 2020-11-14 | Discharge: 2020-11-14 | Disposition: A | Discharge status: Home / Self Care | Payer: Self-pay | Source: Ambulatory Visit | Attending: Obstetrics | Admitting: Obstetrics

## 2020-11-14 ENCOUNTER — Other Ambulatory Visit: Payer: Self-pay

## 2020-11-14 ENCOUNTER — Encounter: Payer: Self-pay | Admitting: Obstetrics

## 2020-11-14 ENCOUNTER — Ambulatory Visit (INDEPENDENT_AMBULATORY_CARE_PROVIDER_SITE_OTHER): Payer: Self-pay | Admitting: Obstetrics

## 2020-11-14 VITALS — BP 128/84 | HR 72 | Wt 178.2 lb

## 2020-11-14 DIAGNOSIS — O09529 Supervision of elderly multigravida, unspecified trimester: Secondary | ICD-10-CM | POA: Insufficient documentation

## 2020-11-14 NOTE — Addendum Note (Signed)
Addended by: Dalphine Handing on: 11/14/2020 09:12 AM   Modules accepted: Orders

## 2020-11-14 NOTE — Progress Notes (Signed)
Pt presents NOB this is not a planned pregnancy and FOB is involved and living together.  Pt c/o nausea and vomiting but declines meds. PHQ9 = 7 - pt declines referral  Agrees to genetic screening - 36 yr old daughter has spina bifida

## 2020-11-14 NOTE — Progress Notes (Signed)
Subjective:    Debra Eaton is being seen today for her first obstetrical visit.  This is a planned pregnancy. She is at [redacted]w[redacted]d gestation. Her obstetrical history is significant for advanced maternal age. Relationship with FOB: spouse, living together. Patient does intend to breast feed. Pregnancy history fully reviewed.  The information documented in the HPI was reviewed and verified.  Menstrual History: OB History     Gravida  6   Para  4   Term  4   Preterm  0   AB  1   Living  4      SAB  1   IAB  0   Ectopic  0   Multiple  0   Live Births  4            Patient's last menstrual period was 07/29/2020.    History reviewed. No pertinent past medical history.  History reviewed. No pertinent surgical history.  (Not in a hospital admission)  No Known Allergies  Social History   Tobacco Use   Smoking status: Never   Smokeless tobacco: Never  Substance Use Topics   Alcohol use: Yes    Comment: occasional    Family History  Problem Relation Age of Onset   Diabetes Mother    Heart disease Father    Kidney disease Father    Liver disease Father      Review of Systems Constitutional: negative for weight loss Gastrointestinal: negative for vomiting Genitourinary:negative for genital lesions and vaginal discharge and dysuria Musculoskeletal:negative for back pain Behavioral/Psych: negative for abusive relationship, depression, illegal drug usage and tobacco use    Objective:    BP 128/84   Pulse 72   Wt 178 lb 3.2 oz (80.8 kg)   LMP 07/29/2020   BMI 32.59 kg/m  General Appearance:    Alert, cooperative, no distress, appears stated age  Head:    Normocephalic, without obvious abnormality, atraumatic  Eyes:    PERRL, conjunctiva/corneas clear, EOM's intact, fundi    benign, both eyes  Ears:    Normal TM's and external ear canals, both ears  Nose:   Nares normal, septum midline, mucosa normal, no drainage    or sinus tenderness   Throat:   Lips, mucosa, and tongue normal; teeth and gums normal  Neck:   Supple, symmetrical, trachea midline, no adenopathy;    thyroid:  no enlargement/tenderness/nodules; no carotid   bruit or JVD  Back:     Symmetric, no curvature, ROM normal, no CVA tenderness  Lungs:     Clear to auscultation bilaterally, respirations unlabored  Chest Wall:    No tenderness or deformity   Heart:    Regular rate and rhythm, S1 and S2 normal, no murmur, rub   or gallop  Breast Exam:    No tenderness, masses, or nipple abnormality  Abdomen:     Soft, non-tender, bowel sounds active all four quadrants,    no masses, no organomegaly  Genitalia:    Normal female without lesion, discharge or tenderness  Extremities:   Extremities normal, atraumatic, no cyanosis or edema  Pulses:   2+ and symmetric all extremities  Skin:   Skin color, texture, turgor normal, no rashes or lesions  Lymph nodes:   Cervical, supraclavicular, and axillary nodes normal  Neurologic:   CNII-XII intact, normal strength, sensation and reflexes    throughout      Lab Review Urine pregnancy test Labs reviewed yes Radiologic studies reviewed no  Assessment:  1. Encounter for supervision of high risk multigravida of advanced maternal age, antepartum Rx: - Culture, OB Urine - Genetic Screening - Cervicovaginal ancillary only - Cytology - PAP( [facility]) - Prenatal Profile I - HIV Antibody (routine testing w rflx) - Enroll Patient in PreNatal Babyscripts     Plan:    Prenatal vitamins.  Counseling provided regarding continued use of seat belts, cessation of alcohol consumption, smoking or use of illicit drugs; infection precautions i.e., influenza/TDAP immunizations, toxoplasmosis,CMV, parvovirus, listeria and varicella; workplace safety, exercise during pregnancy; routine dental care, safe medications, sexual activity, hot tubs, saunas, pools, travel, caffeine use, fish and methlymercury, potential toxins, hair  treatments, varicose veins Weight gain recommendations per IOM guidelines reviewed: underweight/BMI< 18.5--> gain 28 - 40 lbs; normal weight/BMI 18.5 - 24.9--> gain 25 - 35 lbs; overweight/BMI 25 - 29.9--> gain 15 - 25 lbs; obese/BMI >30->gain  11 - 20 lbs Problem list reviewed and updated. FIRST/CF mutation testing/NIPT/QUAD SCREEN/fragile X/Ashkenazi Jewish population testing/Spinal muscular atrophy discussed: requested. Role of ultrasound in pregnancy discussed; fetal survey: requested. Amniocentesis discussed: not indicated.   Orders Placed This Encounter  Procedures   Culture, OB Urine   Genetic Screening    [facility]   Prenatal Profile I   HIV Antibody (routine testing w rflx)    Return visit:  Return in 4 weeks  I have spent a total of 20 minutes of face-to-face time, excluding clinical staff time, reviewing notes and preparing to see patient, ordering tests and/or medications, and counseling the patient.   Brock Bad, MD 11/14/2020 9:05 AM

## 2020-11-16 LAB — PRENATAL PROFILE I(LABCORP)
Antibody Screen: NEGATIVE
Basophils Absolute: 0 10*3/uL (ref 0.0–0.2)
Basos: 0 %
EOS (ABSOLUTE): 0.2 10*3/uL (ref 0.0–0.4)
Eos: 2 %
Hematocrit: 35 % (ref 34.0–46.6)
Hemoglobin: 12 g/dL (ref 11.1–15.9)
Hepatitis B Surface Ag: NEGATIVE
Immature Grans (Abs): 0 10*3/uL (ref 0.0–0.1)
Immature Granulocytes: 0 %
Lymphocytes Absolute: 2 10*3/uL (ref 0.7–3.1)
Lymphs: 25 %
MCH: 29.8 pg (ref 26.6–33.0)
MCHC: 34.3 g/dL (ref 31.5–35.7)
MCV: 87 fL (ref 79–97)
Monocytes Absolute: 0.6 10*3/uL (ref 0.1–0.9)
Monocytes: 8 %
Neutrophils Absolute: 5 10*3/uL (ref 1.4–7.0)
Neutrophils: 65 %
Platelets: 265 10*3/uL (ref 150–450)
RBC: 4.03 x10E6/uL (ref 3.77–5.28)
RDW: 13.9 % (ref 11.7–15.4)
RPR Ser Ql: NONREACTIVE
Rh Factor: POSITIVE
Rubella Antibodies, IGG: 22.4 index (ref >0.99)
WBC: 7.8 10*3/uL (ref 3.4–10.8)

## 2020-11-16 LAB — CULTURE, OB URINE

## 2020-11-16 LAB — CERVICOVAGINAL ANCILLARY ONLY
Bacterial Vaginitis (gardnerella): POSITIVE — AB
Candida Glabrata: NEGATIVE
Candida Vaginitis: NEGATIVE
Chlamydia: NEGATIVE
Comment: NEGATIVE
Comment: NEGATIVE
Comment: NEGATIVE
Comment: NEGATIVE
Comment: NEGATIVE
Comment: NORMAL
Neisseria Gonorrhea: NEGATIVE
Trichomonas: NEGATIVE

## 2020-11-16 LAB — AFP, SERUM, OPEN SPINA BIFIDA
AFP MoM: 0.81
AFP Value: 22.9 ng/mL
Gest. Age on Collection Date: 15.3 weeks
Maternal Age At EDD: 36.4 yr
OSBR Risk 1 IN: 10000
Test Results:: NEGATIVE
Weight: 178 [lb_av]

## 2020-11-16 LAB — URINE CULTURE, OB REFLEX

## 2020-11-16 LAB — HIV ANTIBODY (ROUTINE TESTING W REFLEX): HIV Screen 4th Generation wRfx: NONREACTIVE

## 2020-11-17 ENCOUNTER — Other Ambulatory Visit: Payer: Self-pay | Admitting: Obstetrics

## 2020-11-17 DIAGNOSIS — B9689 Other specified bacterial agents as the cause of diseases classified elsewhere: Secondary | ICD-10-CM

## 2020-11-17 DIAGNOSIS — N760 Acute vaginitis: Secondary | ICD-10-CM

## 2020-11-17 MED ORDER — METRONIDAZOLE 500 MG PO TABS
500.0000 mg | ORAL_TABLET | Freq: Two times a day (BID) | ORAL | 2 refills | Status: DC
Start: 2020-11-17 — End: 2021-01-09

## 2020-11-18 ENCOUNTER — Telehealth: Payer: Self-pay

## 2020-11-18 NOTE — Telephone Encounter (Signed)
-----   Message from Brock Bad, MD sent at 11/17/2020 12:33 PM EDT ----- Flagyl Rx for BV

## 2020-11-18 NOTE — Telephone Encounter (Signed)
Called patient to inform her of test results.

## 2020-11-20 ENCOUNTER — Encounter: Payer: Self-pay | Admitting: Obstetrics

## 2020-11-21 LAB — CYTOLOGY - PAP
Comment: NEGATIVE
Diagnosis: UNDETERMINED — AB
High risk HPV: NEGATIVE

## 2020-11-25 ENCOUNTER — Encounter: Payer: Self-pay | Admitting: Obstetrics

## 2020-12-12 ENCOUNTER — Encounter: Payer: Self-pay | Admitting: Obstetrics and Gynecology

## 2020-12-12 ENCOUNTER — Ambulatory Visit (INDEPENDENT_AMBULATORY_CARE_PROVIDER_SITE_OTHER): Payer: Self-pay | Admitting: Obstetrics and Gynecology

## 2020-12-12 ENCOUNTER — Other Ambulatory Visit: Payer: Self-pay

## 2020-12-12 VITALS — BP 112/71 | HR 64 | Wt 181.0 lb

## 2020-12-12 DIAGNOSIS — Z3480 Encounter for supervision of other normal pregnancy, unspecified trimester: Secondary | ICD-10-CM

## 2020-12-12 NOTE — Progress Notes (Signed)
   PRENATAL VISIT NOTE  Subjective:  Debra Eaton is a 35 y.o. [id number] at [redacted]w[redacted]d being seen today for ongoing prenatal care.  She is currently monitored for the following issues for this low-risk pregnancy and has Supervision of normal pregnancy, antepartum on their problem list.  Patient reports no complaints.  Contractions: Not present.  .  Movement: Present. Denies leaking of fluid.   The following portions of the patient's history were reviewed and updated as appropriate: allergies, current medications, past family history, past medical history, past social history, past surgical history and problem list.   Objective:   Vitals:   12/12/20 0817  BP: 112/71  Pulse: 64  Weight: 181 lb (82.1 kg)    Fetal Status: Fetal Heart Rate (bpm): 147   Movement: Present     General:  Alert, oriented and cooperative. Patient is in no acute distress.  Skin: Skin is warm and dry. No rash noted.   Cardiovascular: Normal heart rate noted  Respiratory: Normal respiratory effort, no problems with respiration noted  Abdomen: Soft, gravid, appropriate for gestational age.  Pain/Pressure: Absent     Pelvic: Cervical exam deferred        Extremities: Normal range of motion.  Edema: None  Mental Status: Normal mood and affect. Normal behavior. Normal judgment and thought content.   Assessment and Plan:  Pregnancy: [id number] at [redacted]w[redacted]d 1. Supervision of other normal pregnancy, antepartum Patient is doing well without complaints Patient had anatomy ultrasound at [facility]- report requested  Preterm labor symptoms and general obstetric precautions including but not limited to vaginal bleeding, contractions, leaking of fluid and fetal movement were reviewed in detail with the patient. Please refer to After Visit Summary for other counseling recommendations.   Return in about 4 weeks (around 01/09/2021) for in person, ROB, Low risk.  Future Appointments  Date Time Provider Department Center  01/09/2021   9:35 AM Hermina Staggers, MD [facility] None    Catalina Antigua, MD

## 2021-01-09 ENCOUNTER — Other Ambulatory Visit (HOSPITAL_COMMUNITY)
Admission: RE | Admit: 2021-01-09 | Discharge: 2021-01-09 | Disposition: A | Discharge status: Home / Self Care | Payer: Self-pay | Source: Ambulatory Visit | Attending: Obstetrics and Gynecology | Admitting: Obstetrics and Gynecology

## 2021-01-09 ENCOUNTER — Ambulatory Visit (INDEPENDENT_AMBULATORY_CARE_PROVIDER_SITE_OTHER): Payer: Self-pay | Admitting: Obstetrics and Gynecology

## 2021-01-09 ENCOUNTER — Encounter: Payer: Self-pay | Admitting: Obstetrics and Gynecology

## 2021-01-09 ENCOUNTER — Other Ambulatory Visit: Payer: Self-pay

## 2021-01-09 VITALS — BP 109/74 | HR 65 | Wt 181.0 lb

## 2021-01-09 DIAGNOSIS — N898 Other specified noninflammatory disorders of vagina: Secondary | ICD-10-CM

## 2021-01-09 DIAGNOSIS — Z3480 Encounter for supervision of other normal pregnancy, unspecified trimester: Secondary | ICD-10-CM

## 2021-01-09 NOTE — Progress Notes (Signed)
Subjective:  Debra Eaton is a 35 y.o. [id number] at [redacted]w[redacted]d being seen today for ongoing prenatal care.  She is currently monitored for the following issues for this low-risk pregnancy and has Supervision of normal pregnancy, antepartum on their problem list.  Patient reports no complaints.  Contractions: Not present. Vag. Bleeding: None.  Movement: Present. Denies leaking of fluid.   The following portions of the patient's history were reviewed and updated as appropriate: allergies, current medications, past family history, past medical history, past social history, past surgical history and problem list. Problem list updated.  Objective:   Vitals:   01/09/21 0945  BP: 109/74  Pulse: 65  Weight: 181 lb (82.1 kg)    Fetal Status: Fetal Heart Rate (bpm): 156   Movement: Present     General:  Alert, oriented and cooperative. Patient is in no acute distress.  Skin: Skin is warm and dry. No rash noted.   Cardiovascular: Normal heart rate noted  Respiratory: Normal respiratory effort, no problems with respiration noted  Abdomen: Soft, gravid, appropriate for gestational age. Pain/Pressure: Present     Pelvic:  Cervical exam deferred        Extremities: Normal range of motion.  Edema: None  Mental Status: Normal mood and affect. Normal behavior. Normal judgment and thought content.   Urinalysis:      Assessment and Plan:  Pregnancy: [id number] at [redacted]w[redacted]d  1. Supervision of other normal pregnancy, antepartum Stable Glucola next visit  2. Vaginal discharge Self swab today  Preterm labor symptoms and general obstetric precautions including but not limited to vaginal bleeding, contractions, leaking of fluid and fetal movement were reviewed in detail with the patient. Please refer to After Visit Summary for other counseling recommendations.  Return in about 4 weeks (around 02/06/2021) for OB visit, face to face, any provider, fasting for Glucola.   Hermina Staggers, MD

## 2021-01-09 NOTE — Progress Notes (Signed)
ROB [redacted]w[redacted]d  CC:  1 wks pt noticed a yellow like discharge with vaginal odor pt would like vaginal swab.  Pt wants to do self swab for BV an yeast only.  Pt had U/S done at [facility] not in chart pt states she has not been told gender of her baby.

## 2021-01-09 NOTE — Patient Instructions (Signed)

## 2021-01-13 LAB — CERVICOVAGINAL ANCILLARY ONLY
Bacterial Vaginitis (gardnerella): POSITIVE — AB
Candida Glabrata: NEGATIVE
Candida Vaginitis: POSITIVE — AB
Comment: NEGATIVE
Comment: NEGATIVE
Comment: NEGATIVE

## 2021-01-19 ENCOUNTER — Other Ambulatory Visit: Payer: Self-pay

## 2021-01-19 DIAGNOSIS — B379 Candidiasis, unspecified: Secondary | ICD-10-CM

## 2021-01-19 DIAGNOSIS — N760 Acute vaginitis: Secondary | ICD-10-CM

## 2021-01-19 DIAGNOSIS — B9689 Other specified bacterial agents as the cause of diseases classified elsewhere: Secondary | ICD-10-CM

## 2021-01-19 MED ORDER — METRONIDAZOLE 500 MG PO TABS: 500.0000 mg | ORAL_TABLET | Freq: Two times a day (BID) | ORAL | 0 refills | Status: DC

## 2021-01-19 MED ORDER — TERCONAZOLE 0.4 % VA CREA: 1.0000 | TOPICAL_CREAM | Freq: Every day | VAGINAL | 0 refills | Status: DC

## 2021-02-05 ENCOUNTER — Encounter: Payer: Self-pay | Admitting: Nurse Practitioner

## 2021-02-05 DIAGNOSIS — R87610 Atypical squamous cells of undetermined significance on cytologic smear of cervix (ASC-US): Secondary | ICD-10-CM | POA: Insufficient documentation

## 2021-02-06 ENCOUNTER — Other Ambulatory Visit: Payer: Self-pay

## 2021-02-06 ENCOUNTER — Encounter: Payer: Self-pay | Admitting: Nurse Practitioner

## 2021-02-16 ENCOUNTER — Other Ambulatory Visit: Payer: Self-pay

## 2021-02-16 ENCOUNTER — Ambulatory Visit (INDEPENDENT_AMBULATORY_CARE_PROVIDER_SITE_OTHER): Payer: Self-pay | Admitting: Obstetrics and Gynecology

## 2021-02-16 ENCOUNTER — Encounter: Payer: Self-pay | Admitting: Obstetrics and Gynecology

## 2021-02-16 VITALS — BP 101/67 | HR 73 | Wt 189.0 lb

## 2021-02-16 DIAGNOSIS — Z3A28 28 weeks gestation of pregnancy: Secondary | ICD-10-CM

## 2021-02-16 DIAGNOSIS — Z3480 Encounter for supervision of other normal pregnancy, unspecified trimester: Secondary | ICD-10-CM

## 2021-02-16 NOTE — Progress Notes (Signed)
[facility] OB/GTT. Letter to get FLU and TDAP Vaccines given.

## 2021-02-16 NOTE — Progress Notes (Signed)
Subjective:  Debra Eaton is a 35 y.o. [id number] at [redacted]w[redacted]d being seen today for ongoing prenatal care.  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  Patient reports no complaints.  Contractions: Not present. Vag. Bleeding: None.  Movement: Present. Denies leaking of fluid.   The following portions of the patient's history were reviewed and updated as appropriate: allergies, current medications, past family history, past medical history, past social history, past surgical history and problem list. Problem list updated.  Objective:   Vitals:   02/16/21 0829  BP: 101/67  Pulse: 73  Weight: 189 lb (85.7 kg)    Fetal Status: Fetal Heart Rate (bpm): 151   Movement: Present     General:  Alert, oriented and cooperative. Patient is in no acute distress.  Skin: Skin is warm and dry. No rash noted.   Cardiovascular: Normal heart rate noted  Respiratory: Normal respiratory effort, no problems with respiration noted  Abdomen: Soft, gravid, appropriate for gestational age. Pain/Pressure: Present     Pelvic:  Cervical exam deferred        Extremities: Normal range of motion.  Edema: None  Mental Status: Normal mood and affect. Normal behavior. Normal judgment and thought content.   Urinalysis:      Assessment and Plan:  Pregnancy: B1Y7829 at [redacted]w[redacted]d  1. Supervision of other normal pregnancy, antepartum Stable - Glucose Tolerance, 2 Hours w/1 Hour - CBC - RPR - HIV antibody (with reflex)    Preterm labor symptoms and general obstetric precautions including but not limited to vaginal bleeding, contractions, leaking of fluid and fetal movement were reviewed in detail with the patient. Please refer to After Visit Summary for other counseling recommendations.  Return in about 2 weeks (around 03/02/2021) for OB  visit, face to face, any provider.   Hermina Staggers, MD

## 2021-02-16 NOTE — Patient Instructions (Signed)

## 2021-02-17 LAB — GLUCOSE TOLERANCE, 2 HOURS W/ 1HR
Glucose, 1 hour: 170 mg/dL (ref 70–179)
Glucose, 2 hour: 120 mg/dL (ref 70–152)
Glucose, Fasting: 83 mg/dL (ref 70–91)

## 2021-02-17 LAB — CBC
Hematocrit: 32.1 % — ABNORMAL LOW (ref 34.0–46.6)
Hemoglobin: 10.7 g/dL — ABNORMAL LOW (ref 11.1–15.9)
MCH: 28.7 pg (ref 26.6–33.0)
MCHC: 33.3 g/dL (ref 31.5–35.7)
MCV: 86 fL (ref 79–97)
Platelets: 240 10*3/uL (ref 150–450)
RBC: 3.73 x10E6/uL — ABNORMAL LOW (ref 3.77–5.28)
RDW: 12.6 % (ref 11.7–15.4)
WBC: 10.2 10*3/uL (ref 3.4–10.8)

## 2021-02-17 LAB — HIV ANTIBODY (ROUTINE TESTING W REFLEX): HIV Screen 4th Generation wRfx: NONREACTIVE

## 2021-02-17 LAB — RPR: RPR Ser Ql: NONREACTIVE

## 2021-03-02 ENCOUNTER — Ambulatory Visit (INDEPENDENT_AMBULATORY_CARE_PROVIDER_SITE_OTHER): Payer: Self-pay | Admitting: Obstetrics and Gynecology

## 2021-03-02 ENCOUNTER — Other Ambulatory Visit: Payer: Self-pay

## 2021-03-02 ENCOUNTER — Encounter: Payer: Self-pay | Admitting: Obstetrics and Gynecology

## 2021-03-02 VITALS — BP 104/72 | HR 74 | Wt 192.0 lb

## 2021-03-02 DIAGNOSIS — Z3480 Encounter for supervision of other normal pregnancy, unspecified trimester: Secondary | ICD-10-CM

## 2021-03-02 NOTE — Patient Instructions (Signed)

## 2021-03-02 NOTE — Progress Notes (Signed)
Patient presents for ROB. 

## 2021-03-02 NOTE — Progress Notes (Signed)
Subjective:  Debra Eaton is a 35 y.o. I1W4315 at [redacted]w[redacted]d being seen today for ongoing prenatal care.  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  Patient reports no complaints.  Contractions: Not present. Vag. Bleeding: None.  Movement: Present. Denies leaking of fluid.   The following portions of the patient's history were reviewed and updated as appropriate: allergies, current medications, past family history, past medical history, past social history, past surgical history and problem list. Problem list updated.  Objective:   Vitals:   03/02/21 0911  BP: 104/72  Pulse: 74  Weight: 192 lb (87.1 kg)    Fetal Status: Fetal Heart Rate (bpm): 136   Movement: Present     General:  Alert, oriented and cooperative. Patient is in no acute distress.  Skin: Skin is warm and dry. No rash noted.   Cardiovascular: Normal heart rate noted  Respiratory: Normal respiratory effort, no problems with respiration noted  Abdomen: Soft, gravid, appropriate for gestational age. Pain/Pressure: Present     Pelvic:  Cervical exam deferred        Extremities: Normal range of motion.  Edema: None  Mental Status: Normal mood and affect. Normal behavior. Normal judgment and thought content.   Urinalysis:      Assessment and Plan:  Pregnancy: [id number] at [redacted]w[redacted]d  1. Supervision of other normal pregnancy, antepartum Stable Still undecided about contraception  Preterm labor symptoms and general obstetric precautions including but not limited to vaginal bleeding, contractions, leaking of fluid and fetal movement were reviewed in detail with the patient. Please refer to After Visit Summary for other counseling recommendations.  Return in about 2 weeks (around 03/16/2021) for OB visit, face to face, any provider.   Hermina Staggers, MD

## 2021-03-16 ENCOUNTER — Other Ambulatory Visit: Payer: Self-pay

## 2021-03-16 ENCOUNTER — Ambulatory Visit (INDEPENDENT_AMBULATORY_CARE_PROVIDER_SITE_OTHER): Payer: Self-pay | Admitting: Advanced Practice Midwife

## 2021-03-16 VITALS — BP 120/72 | HR 82 | Wt 191.0 lb

## 2021-03-16 DIAGNOSIS — R232 Flushing: Secondary | ICD-10-CM

## 2021-03-16 DIAGNOSIS — O26893 Other specified pregnancy related conditions, third trimester: Secondary | ICD-10-CM

## 2021-03-16 DIAGNOSIS — R102 Pelvic and perineal pain: Secondary | ICD-10-CM

## 2021-03-16 DIAGNOSIS — O2686 Pruritic urticarial papules and plaques of pregnancy (PUPPP): Secondary | ICD-10-CM

## 2021-03-16 DIAGNOSIS — Z3A32 32 weeks gestation of pregnancy: Secondary | ICD-10-CM

## 2021-03-16 DIAGNOSIS — Z3480 Encounter for supervision of other normal pregnancy, unspecified trimester: Secondary | ICD-10-CM

## 2021-03-16 NOTE — Progress Notes (Signed)
   PRENATAL VISIT NOTE  Subjective:  Debra Eaton is a 35 y.o. N2D7824 at [redacted]w[redacted]d being seen today for ongoing prenatal care.  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  Patient reports  pelvic pressure when sitting or walking for long periods .  Contractions: Not present. Vag. Bleeding: None.  Movement: Present. Denies leaking of fluid.   The following portions of the patient's history were reviewed and updated as appropriate: allergies, current medications, past family history, past medical history, past social history, past surgical history and problem list.   Objective:   Vitals:   03/16/21 1627  BP: 120/72  Pulse: 82  Weight: 191 lb (86.6 kg)    Fetal Status: Fetal Heart Rate (bpm): 145 Fundal Height: 33 cm Movement: Present     General:  Alert, oriented and cooperative. Patient is in no acute distress.  Skin: Skin is warm and dry. No rash noted.   Cardiovascular: Normal heart rate noted  Respiratory: Normal respiratory effort, no problems with respiration noted  Abdomen: Soft, gravid, appropriate for gestational age.  Pain/Pressure: Present     Pelvic: Cervical exam deferred        Extremities: Normal range of motion.     Mental Status: Normal mood and affect. Normal behavior. Normal judgment and thought content.   Assessment and Plan:  Pregnancy: M3N3614 at [redacted]w[redacted]d 1. Supervision of other normal pregnancy, antepartum --Anticipatory guidance about next visits/weeks of pregnancy given. --Next visit in 2 weeks  2. PUPP (pruritic urticarial papules and plaques of pregnancy) --Mild rash of abdomen, not bothering pt, no itching or other symptoms  3. [redacted] weeks gestation of pregnancy   4. Hot flashes --Episodes of sweating and feeling hot. Likely pregnancy hormones.  Offered TSH today, pt would  prefer to wait if symptom persists.  --Increase PO fluids  5. Pelvic pain affecting pregnancy in third trimester, antepartum --Rest/ice/heat/warm bath/increase PO fluids/Tylenol/pregnancy support belt --Pt to get support belt, wear daily   Preterm labor symptoms and general obstetric precautions including but not limited to vaginal bleeding, contractions, leaking of fluid and fetal movement were reviewed in detail with the patient. Please refer to After Visit Summary for other counseling recommendations.   Return in about 2 weeks (around 03/30/2021).  Future Appointments  Date Time Provider Department Center  03/30/2021  9:55 AM Leftwich-Kirby, Wilmer Floor, CNM [facility] None    Sharen Counter, CNM

## 2021-03-30 ENCOUNTER — Other Ambulatory Visit: Payer: Self-pay

## 2021-03-30 ENCOUNTER — Ambulatory Visit (INDEPENDENT_AMBULATORY_CARE_PROVIDER_SITE_OTHER): Payer: Self-pay | Admitting: Advanced Practice Midwife

## 2021-03-30 VITALS — BP 104/62 | HR 72 | Wt 196.0 lb

## 2021-03-30 DIAGNOSIS — Z3A34 34 weeks gestation of pregnancy: Secondary | ICD-10-CM

## 2021-03-30 DIAGNOSIS — Z3480 Encounter for supervision of other normal pregnancy, unspecified trimester: Secondary | ICD-10-CM

## 2021-03-30 NOTE — Progress Notes (Signed)
   PRENATAL VISIT NOTE  Subjective:  Debra Eaton is a 35 y.o. [id number] at [redacted]w[redacted]d being seen today for ongoing prenatal care.  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  Patient reports no complaints.  Contractions: Not present. Vag. Bleeding: None.  Movement: Present. Denies leaking of fluid.   The following portions of the patient's history were reviewed and updated as appropriate: allergies, current medications, past family history, past medical history, past social history, past surgical history and problem list.   Objective:   Vitals:   03/30/21 0935  BP: 104/62  Pulse: 72  Weight: 196 lb (88.9 kg)    Fetal Status: Fetal Heart Rate (bpm): 150 Fundal Height: 35 cm Movement: Present     General:  Alert, oriented and cooperative. Patient is in no acute distress.  Skin: Skin is warm and dry. No rash noted.   Cardiovascular: Normal heart rate noted  Respiratory: Normal respiratory effort, no problems with respiration noted  Abdomen: Soft, gravid, appropriate for gestational age.  Pain/Pressure: Present     Pelvic: Cervical exam deferred        Extremities: Normal range of motion.  Edema: None  Mental Status: Normal mood and affect. Normal behavior. Normal judgment and thought content.   Assessment and Plan:  Pregnancy: G3T5176 at [redacted]w[redacted]d 1. Supervision of other normal pregnancy, antepartum --Anticipatory guidance about next visits/weeks of pregnancy given. --Next visit in 2 weeks for GBS/GTT  2. [redacted] weeks gestation of pregnancy   Preterm labor symptoms and general obstetric precautions including but not limited to vaginal bleeding, contractions, leaking of fluid and fetal movement were reviewed in detail with the patient. Please refer to After Visit Summary for other counseling recommendations.    Return in about 2 weeks (around 04/13/2021).  No future appointments.   Sharen Counter, CNM

## 2021-04-13 ENCOUNTER — Other Ambulatory Visit (HOSPITAL_COMMUNITY)
Admission: RE | Admit: 2021-04-13 | Discharge: 2021-04-13 | Disposition: A | Discharge status: Home / Self Care | Payer: Self-pay | Source: Ambulatory Visit | Attending: Advanced Practice Midwife | Admitting: Advanced Practice Midwife

## 2021-04-13 ENCOUNTER — Other Ambulatory Visit: Payer: Self-pay

## 2021-04-13 ENCOUNTER — Ambulatory Visit (INDEPENDENT_AMBULATORY_CARE_PROVIDER_SITE_OTHER): Payer: Self-pay | Admitting: Advanced Practice Midwife

## 2021-04-13 VITALS — BP 103/67 | HR 73 | Wt 196.0 lb

## 2021-04-13 DIAGNOSIS — R102 Pelvic and perineal pain: Secondary | ICD-10-CM

## 2021-04-13 DIAGNOSIS — Z3A36 36 weeks gestation of pregnancy: Secondary | ICD-10-CM

## 2021-04-13 DIAGNOSIS — Z3480 Encounter for supervision of other normal pregnancy, unspecified trimester: Secondary | ICD-10-CM

## 2021-04-13 DIAGNOSIS — O26893 Other specified pregnancy related conditions, third trimester: Secondary | ICD-10-CM

## 2021-04-13 DIAGNOSIS — R232 Flushing: Secondary | ICD-10-CM

## 2021-04-13 NOTE — Progress Notes (Signed)
   PRENATAL VISIT NOTE  Subjective:  Debra Eaton is a 35 y.o. I3B0488 at [redacted]w[redacted]d being seen today for ongoing prenatal care.  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  Patient reports no complaints.  Contractions: Not present. Vag. Bleeding: None.  Movement: Present. Denies leaking of fluid.   The following portions of the patient's history were reviewed and updated as appropriate: allergies, current medications, past family history, past medical history, past social history, past surgical history and problem list.   Objective:   Vitals:   04/13/21 1339  BP: 103/67  Pulse: 73  Weight: 196 lb (88.9 kg)    Fetal Status: Fetal Heart Rate (bpm): 140 Fundal Height: 36 cm Movement: Present  Presentation: Vertex  General:  Alert, oriented and cooperative. Patient is in no acute distress.  Skin: Skin is warm and dry. No rash noted.   Cardiovascular: Normal heart rate noted  Respiratory: Normal respiratory effort, no problems with respiration noted  Abdomen: Soft, gravid, appropriate for gestational age.  Pain/Pressure: Present     Pelvic: Cervical exam deferred Dilation: Fingertip Effacement (%): 50 Station: -3  Extremities: Normal range of motion.  Edema: None  Mental Status: Normal mood and affect. Normal behavior. Normal judgment and thought content.   Assessment and Plan:  Pregnancy: Q9V6945 at [redacted]w[redacted]d 1. Supervision of other normal pregnancy, antepartum --Anticipatory guidance about next visits/weeks of pregnancy given. --Next visit in 1 week  - Culture, beta strep (group b only) - Cervicovaginal ancillary only( [facility])  2. Hot flashes --improved  3. Pelvic pain affecting pregnancy in third trimester, antepartum --improved  4. [redacted] weeks gestation of pregnancy   Preterm labor symptoms  and general obstetric precautions including but not limited to vaginal bleeding, contractions, leaking of fluid and fetal movement were reviewed in detail with the patient. Please refer to After Visit Summary for other counseling recommendations.   No follow-ups on file.  Future Appointments  Date Time Provider Department Center  04/21/2021  1:50 PM Adam Phenix, MD [facility] None    Sharen Counter, CNM

## 2021-04-14 LAB — CERVICOVAGINAL ANCILLARY ONLY
Chlamydia: NEGATIVE
Comment: NEGATIVE
Comment: NORMAL
Neisseria Gonorrhea: NEGATIVE

## 2021-04-17 LAB — CULTURE, BETA STREP (GROUP B ONLY): Strep Gp B Culture: NEGATIVE

## 2021-04-21 ENCOUNTER — Other Ambulatory Visit: Payer: Self-pay

## 2021-04-21 ENCOUNTER — Ambulatory Visit (INDEPENDENT_AMBULATORY_CARE_PROVIDER_SITE_OTHER): Payer: Self-pay | Admitting: Obstetrics & Gynecology

## 2021-04-21 VITALS — BP 113/59 | HR 72 | Wt 197.8 lb

## 2021-04-21 DIAGNOSIS — Z3480 Encounter for supervision of other normal pregnancy, unspecified trimester: Secondary | ICD-10-CM

## 2021-04-21 DIAGNOSIS — R87610 Atypical squamous cells of undetermined significance on cytologic smear of cervix (ASC-US): Secondary | ICD-10-CM

## 2021-04-21 NOTE — Progress Notes (Signed)
° °  PRENATAL VISIT NOTE  Subjective:  Debra Eaton is a 35 y.o. Z2Y4825 at [redacted]w[redacted]d being seen today for ongoing prenatal care.  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  Patient reports no complaints.  Contractions: Irritability. Vag. Bleeding: None.  Movement: Present. Denies leaking of fluid.   The following portions of the patient's history were reviewed and updated as appropriate: allergies, current medications, past family history, past medical history, past social history, past surgical history and problem list.   Objective:   Vitals:   04/21/21 1358  BP: (!) 113/59  Pulse: 72  Weight: 197 lb 12.8 oz (89.7 kg)    Fetal Status: Fetal Heart Rate (bpm): 153 Fundal Height: 37 cm Movement: Present     General:  Alert, oriented and cooperative. Patient is in no acute distress.  Skin: Skin is warm and dry. No rash noted.   Cardiovascular: Normal heart rate noted  Respiratory: Normal respiratory effort, no problems with respiration noted  Abdomen: Soft, gravid, appropriate for gestational age.  Pain/Pressure: Present     Pelvic: Cervical exam deferred        Extremities: Normal range of motion.     Mental Status: Normal mood and affect. Normal behavior. Normal judgment and thought content.   Assessment and Plan:  Pregnancy: [id number] at [redacted]w[redacted]d 1. 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 Supervision of other normal pregnancy, antepartum Doing well  Term labor symptoms and general obstetric precautions including but not limited to vaginal bleeding, contractions, leaking of fluid and fetal movement were reviewed in detail with the patient. Please refer to After Visit Summary for other counseling recommendations.   Return in about 1 week (around 04/28/2021).  No future  appointments.  Scheryl Darter, MD

## 2021-04-26 ENCOUNTER — Other Ambulatory Visit: Payer: Self-pay

## 2021-04-26 ENCOUNTER — Inpatient Hospital Stay (HOSPITAL_COMMUNITY)
Admission: AD | Admit: 2021-04-26 | Discharge: 2021-04-28 | DRG: 807 | Disposition: A | Discharge status: Home / Self Care | Payer: Medicaid Other | Attending: Obstetrics and Gynecology | Admitting: Obstetrics and Gynecology

## 2021-04-26 DIAGNOSIS — R87610 Atypical squamous cells of undetermined significance on cytologic smear of cervix (ASC-US): Secondary | ICD-10-CM | POA: Diagnosis present

## 2021-04-26 DIAGNOSIS — Z23 Encounter for immunization: Secondary | ICD-10-CM

## 2021-04-26 DIAGNOSIS — Z3490 Encounter for supervision of normal pregnancy, unspecified, unspecified trimester: Secondary | ICD-10-CM

## 2021-04-26 DIAGNOSIS — Z8279 Family history of other congenital malformations, deformations and chromosomal abnormalities: Secondary | ICD-10-CM

## 2021-04-26 DIAGNOSIS — Z20822 Contact with and (suspected) exposure to covid-19: Secondary | ICD-10-CM | POA: Diagnosis present

## 2021-04-26 DIAGNOSIS — Z3A38 38 weeks gestation of pregnancy: Secondary | ICD-10-CM

## 2021-04-27 ENCOUNTER — Inpatient Hospital Stay (HOSPITAL_COMMUNITY): Payer: Medicaid Other | Admitting: Anesthesiology

## 2021-04-27 ENCOUNTER — Encounter (HOSPITAL_COMMUNITY): Payer: Self-pay | Admitting: Obstetrics and Gynecology

## 2021-04-27 DIAGNOSIS — Z23 Encounter for immunization: Secondary | ICD-10-CM | POA: Diagnosis not present

## 2021-04-27 DIAGNOSIS — Z20822 Contact with and (suspected) exposure to covid-19: Secondary | ICD-10-CM | POA: Diagnosis present

## 2021-04-27 DIAGNOSIS — Z8279 Family history of other congenital malformations, deformations and chromosomal abnormalities: Secondary | ICD-10-CM

## 2021-04-27 DIAGNOSIS — Z3A38 38 weeks gestation of pregnancy: Secondary | ICD-10-CM

## 2021-04-27 DIAGNOSIS — O26893 Other specified pregnancy related conditions, third trimester: Secondary | ICD-10-CM | POA: Diagnosis present

## 2021-04-27 LAB — CBC
HCT: 32.4 % — ABNORMAL LOW (ref 36.0–46.0)
Hemoglobin: 10.7 g/dL — ABNORMAL LOW (ref 12.0–15.0)
MCH: 27.2 pg (ref 26.0–34.0)
MCHC: 33 g/dL (ref 30.0–36.0)
MCV: 82.4 fL (ref 80.0–100.0)
Platelets: 240 10*3/uL (ref 150–400)
RBC: 3.93 MIL/uL (ref 3.87–5.11)
RDW: 15.2 % (ref 11.5–15.5)
WBC: 9.4 10*3/uL (ref 4.0–10.5)
nRBC: 0 % (ref 0.0–0.2)

## 2021-04-27 LAB — RESP PANEL BY RT-PCR (FLU A&B, COVID) ARPGX2
Influenza A by PCR: NEGATIVE
Influenza B by PCR: NEGATIVE
SARS Coronavirus 2 by RT PCR: NEGATIVE

## 2021-04-27 LAB — TYPE AND SCREEN
ABO/RH(D): O POS
Antibody Screen: NEGATIVE

## 2021-04-27 LAB — RPR: RPR Ser Ql: NONREACTIVE

## 2021-04-27 MED ORDER — DIPHENHYDRAMINE HCL 50 MG/ML IJ SOLN: 12.5000 mg | INTRAMUSCULAR | Status: DC | PRN

## 2021-04-27 MED ORDER — OXYTOCIN BOLUS FROM INFUSION
333.0000 mL | Freq: Once | INTRAVENOUS | Status: AC
  Administered 2021-04-27: 06:00:00 333 mL via INTRAVENOUS

## 2021-04-27 MED ORDER — PHENYLEPHRINE 40 MCG/ML (10ML) SYRINGE FOR IV PUSH (FOR BLOOD PRESSURE SUPPORT)
PREFILLED_SYRINGE | INTRAVENOUS | Status: AC
  Filled 2021-04-27: qty 10

## 2021-04-27 MED ORDER — TETANUS-DIPHTH-ACELL PERTUSSIS 5-2.5-18.5 LF-MCG/0.5 IM SUSY: 0.5000 mL | PREFILLED_SYRINGE | Freq: Once | INTRAMUSCULAR | Status: DC

## 2021-04-27 MED ORDER — DIPHENHYDRAMINE HCL 25 MG PO CAPS: 25.0000 mg | ORAL_CAPSULE | Freq: Four times a day (QID) | ORAL | Status: DC | PRN

## 2021-04-27 MED ORDER — LACTATED RINGERS IV SOLN: 500.0000 mL | INTRAVENOUS | Status: DC | PRN

## 2021-04-27 MED ORDER — OXYTOCIN-SODIUM CHLORIDE 30-0.9 UT/500ML-% IV SOLN
2.5000 [IU]/h | INTRAVENOUS | Status: DC
  Administered 2021-04-27: 06:00:00 2.5 [IU]/h via INTRAVENOUS
  Filled 2021-04-27: qty 500

## 2021-04-27 MED ORDER — EPHEDRINE 5 MG/ML INJ: 10.0000 mg | INTRAVENOUS | Status: DC | PRN

## 2021-04-27 MED ORDER — IBUPROFEN 600 MG PO TABS
600.0000 mg | ORAL_TABLET | Freq: Four times a day (QID) | ORAL | Status: DC
  Administered 2021-04-27 – 2021-04-28 (×5): 600 mg via ORAL
  Filled 2021-04-27 (×5): qty 1

## 2021-04-27 MED ORDER — ACETAMINOPHEN 325 MG PO TABS: 650.0000 mg | ORAL_TABLET | ORAL | Status: DC | PRN

## 2021-04-27 MED ORDER — PHENYLEPHRINE 40 MCG/ML (10ML) SYRINGE FOR IV PUSH (FOR BLOOD PRESSURE SUPPORT): 80.0000 ug | PREFILLED_SYRINGE | INTRAVENOUS | Status: DC | PRN

## 2021-04-27 MED ORDER — LIDOCAINE HCL (PF) 1 % IJ SOLN: 30.0000 mL | INTRAMUSCULAR | Status: DC | PRN

## 2021-04-27 MED ORDER — OXYCODONE-ACETAMINOPHEN 5-325 MG PO TABS: 1.0000 | ORAL_TABLET | ORAL | Status: DC | PRN

## 2021-04-27 MED ORDER — ONDANSETRON HCL 4 MG/2ML IJ SOLN: 4.0000 mg | Freq: Four times a day (QID) | INTRAMUSCULAR | Status: DC | PRN

## 2021-04-27 MED ORDER — SENNOSIDES-DOCUSATE SODIUM 8.6-50 MG PO TABS
2.0000 | ORAL_TABLET | Freq: Every day | ORAL | Status: DC
  Administered 2021-04-28: 12:00:00 2 via ORAL
  Filled 2021-04-27: qty 2

## 2021-04-27 MED ORDER — TRANEXAMIC ACID-NACL 1000-0.7 MG/100ML-% IV SOLN
INTRAVENOUS | Status: AC
  Filled 2021-04-27: qty 100

## 2021-04-27 MED ORDER — FENTANYL-BUPIVACAINE-NACL 0.5-0.125-0.9 MG/250ML-% EP SOLN
12.0000 mL/h | EPIDURAL | Status: DC | PRN
  Administered 2021-04-27: 04:00:00 12 mL/h via EPIDURAL

## 2021-04-27 MED ORDER — LACTATED RINGERS IV SOLN
500.0000 mL | Freq: Once | INTRAVENOUS | Status: AC
  Administered 2021-04-27: 03:00:00 500 mL via INTRAVENOUS

## 2021-04-27 MED ORDER — LACTATED RINGERS IV SOLN: INTRAVENOUS | Status: DC

## 2021-04-27 MED ORDER — ONDANSETRON HCL 4 MG PO TABS: 4.0000 mg | ORAL_TABLET | ORAL | Status: DC | PRN

## 2021-04-27 MED ORDER — LIDOCAINE HCL (PF) 1 % IJ SOLN
INTRAMUSCULAR | Status: DC | PRN
  Administered 2021-04-27: 8 mL via EPIDURAL

## 2021-04-27 MED ORDER — SIMETHICONE 80 MG PO CHEW: 80.0000 mg | CHEWABLE_TABLET | ORAL | Status: DC | PRN

## 2021-04-27 MED ORDER — FENTANYL-BUPIVACAINE-NACL 0.5-0.125-0.9 MG/250ML-% EP SOLN: 12.0000 mL/h | EPIDURAL | Status: DC | PRN

## 2021-04-27 MED ORDER — TRANEXAMIC ACID-NACL 1000-0.7 MG/100ML-% IV SOLN
1000.0000 mg | INTRAVENOUS | Status: AC
  Administered 2021-04-27: 06:00:00 1000 mg via INTRAVENOUS

## 2021-04-27 MED ORDER — ONDANSETRON HCL 4 MG/2ML IJ SOLN: 4.0000 mg | INTRAMUSCULAR | Status: DC | PRN

## 2021-04-27 MED ORDER — OXYCODONE-ACETAMINOPHEN 5-325 MG PO TABS: 2.0000 | ORAL_TABLET | ORAL | Status: DC | PRN

## 2021-04-27 MED ORDER — PRENATAL MULTIVITAMIN CH
1.0000 | ORAL_TABLET | Freq: Every day | ORAL | Status: DC
  Administered 2021-04-27 – 2021-04-28 (×2): 1 via ORAL
  Filled 2021-04-27 (×2): qty 1

## 2021-04-27 MED ORDER — COCONUT OIL OIL: 1.0000 "application " | TOPICAL_OIL | Status: DC | PRN

## 2021-04-27 MED ORDER — BENZOCAINE-MENTHOL 20-0.5 % EX AERO
1.0000 "application " | INHALATION_SPRAY | CUTANEOUS | Status: DC | PRN
  Administered 2021-04-27: 1 via TOPICAL
  Filled 2021-04-27: qty 56

## 2021-04-27 MED ORDER — INFLUENZA VAC SPLIT QUAD 0.5 ML IM SUSY
0.5000 mL | PREFILLED_SYRINGE | INTRAMUSCULAR | Status: AC
  Administered 2021-04-28: 12:00:00 0.5 mL via INTRAMUSCULAR
  Filled 2021-04-27: qty 0.5

## 2021-04-27 MED ORDER — SOD CITRATE-CITRIC ACID 500-334 MG/5ML PO SOLN: 30.0000 mL | ORAL | Status: DC | PRN

## 2021-04-27 MED ORDER — WITCH HAZEL-GLYCERIN EX PADS: 1.0000 "application " | MEDICATED_PAD | CUTANEOUS | Status: DC | PRN

## 2021-04-27 MED ORDER — DIBUCAINE (PERIANAL) 1 % EX OINT: 1.0000 "application " | TOPICAL_OINTMENT | CUTANEOUS | Status: DC | PRN

## 2021-04-27 MED ORDER — FENTANYL-BUPIVACAINE-NACL 0.5-0.125-0.9 MG/250ML-% EP SOLN
EPIDURAL | Status: AC
  Filled 2021-04-27: qty 250

## 2021-04-27 MED ORDER — ZOLPIDEM TARTRATE 5 MG PO TABS: 5.0000 mg | ORAL_TABLET | Freq: Every evening | ORAL | Status: DC | PRN

## 2021-04-27 NOTE — Anesthesia Procedure Notes (Signed)
Epidural Patient location during procedure: OB Start time: 04/27/2021 3:30 AM End time: 04/27/2021 3:35 AM  Staffing Anesthesiologist: Bethena Midget, MD  Preanesthetic Checklist Completed: patient identified, IV checked, site marked, risks and benefits discussed, surgical consent, monitors and equipment checked, pre-op evaluation and timeout performed  Epidural Patient position: sitting Prep: DuraPrep and site prepped and draped Patient monitoring: continuous pulse ox and blood pressure Approach: midline Location: L4-L5 Injection technique: LOR air  Needle:  Needle type: Tuohy  Needle gauge: 17 G Needle length: 9 cm and 9 Needle insertion depth: 5 cm Catheter type: closed end flexible Catheter size: 19 Gauge Catheter at skin depth: 11 cm Test dose: negative  Assessment Events: blood not aspirated, injection not painful, no injection resistance, no paresthesia and negative IV test

## 2021-04-27 NOTE — Lactation Note (Signed)
This note was copied from a baby's chart. Lactation Consultation Note  Patient Name: Debra Eaton [id number] Date: 04/27/2021 Reason for consult: Initial assessment;Early term 37-38.6wks Age:36 hours   Lactation Initial Consult:  P4 mother whose infant is now 55 hours old.  This is an ETI at 38+6 weeks.  Baby "[name]" was STS on mother's chest and asleep when I arrived.  Reviewed breast feeding basics with mother.  Encouraged lots of STS and hand expression which she is familiar with.  Mother reported leaking "a little bit" during her pregnancy.  Reviewed cues, tummy size, how to awaken and how to call for assistance with latching as desired.  Mother appreciative.  Her biggest concern is knowing whether or not "[name]" is "getting enough."  Reassurance provided and "[name]" has had 2 stools since birth.    Father present and asleep.     Maternal Data Has patient been taught Hand Expression?: Yes Does the patient have breastfeeding experience prior to this delivery?: Yes How long did the patient breastfeed?: 3 months each with her other children; youngest is now 32 years old  Feeding Mother's Current Feeding Choice: Breast Milk  LATCH Score Latch: Grasps breast easily, tongue down, lips flanged, rhythmical sucking.  Audible Swallowing: A few with stimulation  Type of Nipple: Everted at rest and after stimulation  Comfort (Breast/Nipple): Soft / non-tender  Hold (Positioning): Assistance needed to correctly position infant at breast and maintain latch.  LATCH Score: 8   Lactation Tools Discussed/Used    Interventions Interventions: Breast feeding basics reviewed;Education  Discharge WIC Program: Yes  Consult Status Consult Status: Follow-up Date: 04/28/21 Follow-up type: In-patient    Dora Sims 04/27/2021, 9:33 AM

## 2021-04-27 NOTE — Anesthesia Postprocedure Evaluation (Signed)
Anesthesia Post Note  Patient: Debra Eaton  Procedure(s) Performed: AN AD HOC LABOR EPIDURAL     Patient location during evaluation: Mother Baby Anesthesia Type: Epidural Level of consciousness: awake Pain management: satisfactory to patient Vital Signs Assessment: post-procedure vital signs reviewed and stable Respiratory status: spontaneous breathing Cardiovascular status: stable Anesthetic complications: no   No notable events documented.  Last Vitals:  Vitals:   04/27/21 0815 04/27/21 0925  BP: 112/70 111/63  Pulse: 77 69  Resp: 16 16  Temp: 37 C 36.9 C  SpO2: 100% 100%    Last Pain:  Vitals:   04/27/21 0925  TempSrc: Oral  PainSc:    Pain Goal: Patients Stated Pain Goal: 0 (04/27/21 0347)                 Cephus Shelling

## 2021-04-27 NOTE — Discharge Summary (Addendum)
Postpartum Discharge Summary    Patient Name: Debra Eaton DOB: [birth year] MRN: [id number]  Date of admission: 04/26/2021 Delivery date:04/27/2021  Delivering provider: Starr Lake  Date of discharge: 04/28/2021  Admitting diagnosis: Indication for care in labor and delivery, antepartum [O75.9] Intrauterine pregnancy: [redacted]w[redacted]d    Secondary diagnosis:  Principal Problem:   Indication for care in labor and delivery, antepartum Active Problems:   Supervision of normal pregnancy, antepartum   Pap smear abnormality of cervix with ASCUS favoring benign   Family history of spina bifida   Shoulder dystocia during labor and delivery  Additional problems: None  Discharge diagnosis: Term Pregnancy Delivered                                              Post partum procedures: NA Augmentation:  None Complications: None  Hospital course: Onset of Labor With Vaginal Delivery      35y.o. yo [id number] 323w6d admitted in Active Labor on 04/26/2021. Patient had an uncomplicated labor course as follows:  Membrane Rupture Time/Date: 3:56 AM ,04/27/2021   Delivery Method:Vaginal, Spontaneous  Episiotomy: None  Lacerations:  Periurethral  See delivery note regarding shoulder dystocia.  Patient had an uncomplicated postpartum course.  She is ambulating, tolerating a regular diet, passing flatus, and urinating well. Patient is discharged home in stable condition on 04/28/21.  Newborn Data: Birth date:04/27/2021  Birth time:5:33 AM  Gender:Female  Living status:Living  Apgars:9 ,9  Weight:4040 g   Magnesium Sulfate received: No BMZ received: No Rhophylac:No MMR:N/A T-DaP: Unknown Flu: Unknown Transfusion:No  Physical exam  Vitals:   04/27/21 1316 04/27/21 1756 04/27/21 2141 04/28/21 0553  BP: 110/63 113/67 115/71 (!) 99/53  Pulse: 72 78 71 67  Resp: 16 16 18 17   Temp: 98.1 F (36.7 C) 98.2 F (36.8 C) 97.9 F (36.6 C) 98.6 F (37 C)  TempSrc: Oral  Oral Oral Oral  SpO2: 99%  100% 100%  Weight:      Height:       General: alert Lochia: appropriate Uterine Fundus: firm DVT Evaluation: No evidence of DVT seen on physical exam. Labs: Lab Results  Component Value Date   WBC 9.4 04/27/2021   HGB 10.7 (L) 04/27/2021   HCT 32.4 (L) 04/27/2021   MCV 82.4 04/27/2021   PLT 240 04/27/2021   CMP Latest Ref Rng & Units 04/23/2016  Glucose 65 - 99 mg/dL 105(H)  BUN 6 - 20 mg/dL 12  Creatinine 0.44 - 1.00 mg/dL 0.59  Sodium 135 - 145 mmol/L 140  Potassium 3.5 - 5.1 mmol/L 3.9  Chloride 101 - 111 mmol/L 106  CO2 22 - 32 mmol/L 25  Calcium 8.9 - 10.3 mg/dL 9.2   Edinburgh Score: No flowsheet data found.   After visit meds:  Allergies as of 04/28/2021   No Known Allergies      Medication List     TAKE these medications    acetaminophen 325 MG tablet Commonly known as: Tylenol Take 2 tablets (650 mg total) by mouth every 4 (four) hours as needed (for pain scale < 4).   ibuprofen 600 MG tablet Commonly known as: ADVIL Take 1 tablet (600 mg total) by mouth every 6 (six) hours.   multivitamin-prenatal 27-0.8 MG Tabs tablet Take 1 tablet by mouth daily at 12 noon.  Discharge home in stable condition Infant Feeding: Breast Infant Disposition:home with mother Discharge instruction: per After Visit Summary and Postpartum booklet. Activity: Advance as tolerated. Pelvic rest for 6 weeks.  Diet: routine diet Future Appointments: Future Appointments  Date Time Provider [place]  06/09/2021  3:10 PM Woodroe Mode, MD [place] None   Follow up Visit:   Please schedule this patient for a In person postpartum visit in  4-6  with the following provider: Any provider. Additional Postpartum F/U: Needs IUD at [facility] visit   Low risk pregnancy complicated by:  Nothing Delivery mode:  Vaginal, Spontaneous  Anticipated Birth Control:  IUD  Renard Matter, MD, MPH OB Fellow, Faculty Practice

## 2021-04-27 NOTE — MAU Note (Signed)
Debra Eaton is a 35 y.o. at [redacted]w[redacted]d here in MAU reporting: contractions that began in the morning at 2200 pt reports they were 10 apart. Now she states they are closer. Denies SROM or vaginal bleeding. Reports after last urination she noticed small amount of bloody show on toilet tissue   Pain score: 8 Vitals:   04/27/21 0125  BP: 117/82  Pulse: 80  Resp: 17  Temp: 98.6 F (37 C)  SpO2: 100%     FHT:149 Lab orders placed from triage:  mau labor

## 2021-04-27 NOTE — Anesthesia Preprocedure Evaluation (Addendum)
Anesthesia Evaluation  Patient identified by MRN, date of birth, ID band Patient awake    Reviewed: Allergy & Precautions, H&P , NPO status , Patient's Chart, lab work & pertinent test results, reviewed documented beta blocker date and time   Airway Mallampati: II  TM Distance: >3 FB Neck ROM: full    Dental no notable dental hx.    Pulmonary neg pulmonary ROS,    Pulmonary exam normal breath sounds clear to auscultation       Cardiovascular negative cardio ROS Normal cardiovascular exam Rhythm:regular Rate:Normal     Neuro/Psych negative neurological ROS  negative psych ROS   GI/Hepatic negative GI ROS, Neg liver ROS,   Endo/Other  negative endocrine ROS  Renal/GU negative Renal ROS  negative genitourinary   Musculoskeletal   Abdominal   Peds  Hematology negative hematology ROS (+)   Anesthesia Other Findings   Reproductive/Obstetrics (+) Pregnancy                             Anesthesia Physical Anesthesia Plan  ASA: 2  Anesthesia Plan: Epidural   Post-op Pain Management:    Induction:   PONV Risk Score and Plan: 2  Airway Management Planned: Natural Airway  Additional Equipment: None  Intra-op Plan:   Post-operative Plan:   Informed Consent: I have reviewed the patients History and Physical, chart, labs and discussed the procedure including the risks, benefits and alternatives for the proposed anesthesia with the patient or authorized representative who has indicated his/her understanding and acceptance.     Dental Advisory Given  Plan Discussed with: Anesthesiologist  Anesthesia Plan Comments: (Labs checked- platelets confirmed with RN in room. Fetal heart tracing, per RN, reported to be stable enough for sitting procedure. Discussed epidural, and patient consents to the procedure:  included risk of possible headache,backache, failed block, allergic reaction, and  nerve injury. This patient was asked if she had any questions or concerns before the procedure started.)       Anesthesia Quick Evaluation

## 2021-04-27 NOTE — Lactation Note (Addendum)
This note was copied from a baby's chart. Lactation Consultation Note  Patient Name: Boy Lilit Cinelli [id number] Date: 04/27/2021 Reason for consult: Follow-up assessment;RN request;Early term 37-38.6wks Age:36 hours  LC in to visit with P5 Mom of ET infant.  Baby sleeping loosely swaddled in Mom's arms.  Mom reports sleepy baby showing no interest in latching to breast since the 10 min feeding after birth.  Assisted Mom in placing baby prone STS on her chest.  Baby asleep, no cueing noted.  Encouraged Mom to keep baby STS until he is feeding consistently.  MD concerned and wants a CBG drawn by lab.   Encouraged Mom to call out for assistance with latching, when baby is cueing he is hungry.   Consult Status Consult Status: Follow-up Date: 04/28/21 Follow-up type: In-patient    Judee Clara 04/27/2021, 3:26 PM

## 2021-04-27 NOTE — H&P (Addendum)
Debra Eaton is a 35 y.o. female [phone] with IUP at [redacted]w[redacted]d by LMP presenting for labor.  She reports positive fetal movement. She denies leakage of fluid or vaginal bleeding.  Prenatal History/Complications: PNC at [facility]; one daughter has spina bifida Pregnancy complications:  - No Past Medical History: History reviewed. No pertinent past medical history.  Past Surgical History: History reviewed. No pertinent surgical history.  Obstetrical History: OB History     Gravida  6   Para  4   Term  4   Preterm  0   AB  1   Living  4      SAB  1   IAB  0   Ectopic  0   Multiple  0   Live Births  4            Social History: Social History   Socioeconomic History   Marital status: Single    Spouse name: Not on file   Number of children: Not on file   Years of education: Not on file   Highest education level: Not on file  Occupational History   Not on file  Tobacco Use   Smoking status: Never   Smokeless tobacco: Never  Vaping Use   Vaping Use: Not on file  Substance and Sexual Activity   Alcohol use: Yes    Comment: occasional   Drug use: No   Sexual activity: Yes    Birth control/protection: None  Other Topics Concern   Not on file  Social History Narrative   Not on file   Social Determinants of Health   Financial Resource Strain: Not on file  Food Insecurity: Not on file  Transportation Needs: Not on file  Physical Activity: Not on file  Stress: Not on file  Social Connections: Not on file    Family History: Family History  Problem Relation Age of Onset   Diabetes Mother    Heart disease Father    Kidney disease Father    Liver disease Father     Allergies: No Known Allergies  Medications Prior to Admission  Medication Sig Dispense Refill Last Dose   Prenatal Vit-Fe Fumarate-FA (MULTIVITAMIN-PRENATAL) 27-0.8 MG TABS tablet Take 1 tablet by mouth daily at 12 noon.   04/26/2021    Review of Systems    Constitutional: Negative for fever and chills Eyes: Negative for visual disturbances Respiratory: Negative for shortness of breath, dyspnea Cardiovascular: Negative for chest pain or palpitations  Gastrointestinal: Negative for vomiting, diarrhea and constipation.  POSITIVE for abdominal pain (contractions) Genitourinary: Negative for dysuria and urgency Musculoskeletal: Negative for back pain, joint pain, myalgias  Neurological: Negative for dizziness and headaches  Blood pressure 114/68, pulse 68, temperature 98.6 F (37 C), temperature source Oral, resp. rate 18, height 5\' 4"  (1.626 m), weight 91 kg, last menstrual period 07/29/2020, SpO2 98 %, unknown if currently breastfeeding. General appearance: alert, cooperative, and no distress Lungs: normal respiratory effort Heart: regular rate and rhythm Abdomen: soft, non-tender; bowel sounds normal Extremities: Homans sign is negative, no sign of DVT DTR's 2+ Presentation: cephalic Fetal monitoring  Baseline: 135 bpm mod var, present acel, no decels,  Uterine activity  uterine irratability Dilation: 8 Effacement (%): 90 Station: -1 Exam by:: [email or web address], RN   Prenatal labs: ABO, Rh: --/--/O POS (12/19 0235) Antibody: NEG (12/19 0235) Rubella: 22.40 (07/08 0951) RPR: Non Reactive (10/10 0901)  HBsAg: Negative (07/08 0951)  HIV: Non Reactive (10/10 0901)  GBS: Negative/-- (12/05 1408)  1  hr Glucola passed Genetic screening  normal Anatomy US normal  Prenatal Transfer Tool  Maternal Diabetes: No Genetic Screening: Normal Maternal Ultrasounds/Referrals: Normal Fetal Ultrasounds or other Referrals:  Other: normal Maternal Substance Abuse:  No Significant Maternal Medications:  None Significant Maternal Lab Results: Group B Strep negative  Results for orders placed or performed during the hospital encounter of 04/26/21 (from the past 24 hour(s))  Resp Panel by RT-PCR (Flu A&B, Covid) Nasopharyngeal Swab   Collection Time:  04/27/21  2:35 AM   Specimen: Nasopharyngeal Swab; Nasopharyngeal(NP) swabs in vial transport medium  Result Value Ref Range   SARS Coronavirus 2 by RT PCR NEGATIVE NEGATIVE   Influenza A by PCR NEGATIVE NEGATIVE   Influenza B by PCR NEGATIVE NEGATIVE  CBC   Collection Time: 04/27/21  2:35 AM  Result Value Ref Range   WBC 9.4 4.0 - 10.5 K/uL   RBC 3.93 3.87 - 5.11 MIL/uL   Hemoglobin 10.7 (L) 12.0 - 15.0 g/dL   HCT 67.1 (L) 24.5 - 80.9 %   MCV 82.4 80.0 - 100.0 fL   MCH 27.2 26.0 - 34.0 pg   MCHC 33.0 30.0 - 36.0 g/dL   RDW 98.3 38.2 - 50.5 %   Platelets 240 150 - 400 K/uL   nRBC 0.0 0.0 - 0.2 %  Type and screen [facility]   Collection Time: 04/27/21  2:35 AM  Result Value Ref Range   ABO/RH(D) O POS    Antibody Screen NEG    Sample Expiration      04/30/2021,2359 Performed at [facility], [address] [place], [place] [address]     Assessment: Debra Eaton is a 35 y.o. [phone] with an IUP at [redacted]w[redacted]d presenting for SOL. SROm at 8 cm; will continue to monitor; patient appears to be making change on her own  Plan: #Labor: expectant management #Pain:  epidural #FWB Cat 1 #ID: GBS: Neg #MOF:  breast #MOC: IUD at [facility] visit  #Circ: No   Debra Eaton 04/27/2021, 4:41 AM

## 2021-04-28 MED ORDER — ACETAMINOPHEN 325 MG PO TABS: 650.0000 mg | ORAL_TABLET | ORAL | 0 refills | Status: DC | PRN

## 2021-04-28 MED ORDER — IBUPROFEN 600 MG PO TABS: 600.0000 mg | ORAL_TABLET | Freq: Four times a day (QID) | ORAL | 0 refills | Status: DC

## 2021-04-28 NOTE — Lactation Note (Signed)
This note was copied from a baby's chart. Lactation Consultation Note  Patient Name: Debra Eaton [id number] Date: 04/28/2021 Reason for consult: Follow-up assessment;Early term 37-38.6wks Age:36 hours  LC in to visit with P5 Mom of ET infant at 4.5% weight loss.  Mom reports baby has been latching better now.  Mom had been having some soreness, but her RN assisted her with a deeper latch.  Latch score of 8 given. Mom to hand express colostrum onto nipple for soreness.  Encouraged STS with baby and offering breast with feeding cues.  Engorgement prevention and treatment reviewed.  Mom given a manual breast pump to have for home use.  Mom states she has used one before.  Mom aware of OP lactation support and encouraged to call prn.  Lactation Tools Discussed/Used Tools: Pump Breast pump type: Manual Reason for Pumping: PRN  Interventions Interventions: Breast feeding basics reviewed;Skin to skin;Breast massage;Hand express  Discharge Discharge Education: Engorgement and breast care;Warning signs for feeding baby Pump: Manual  Consult Status Consult Status: Complete Date: 04/28/21 Follow-up type: Call as needed    Judee Clara 04/28/2021, 12:49 PM

## 2021-05-01 ENCOUNTER — Encounter: Payer: Self-pay | Admitting: Obstetrics and Gynecology

## 2021-05-08 ENCOUNTER — Telehealth (HOSPITAL_COMMUNITY): Payer: Self-pay

## 2021-05-08 DIAGNOSIS — Z1331 Encounter for screening for depression: Secondary | ICD-10-CM

## 2021-05-08 NOTE — Telephone Encounter (Signed)
"  Doing ok." Patient has no questions or concerns about her healing.  "Baby is doing good. Crying a lot the first day home but doing ok now. We are settling in. Baby is eating and having good diapers. Baby sleeps in a crib." RN reviewed ABC's of safe sleep with patient. Patient declines any questions or concerns about baby.  EPDS score is 5. Answered Hardly Ever 1 to question # 10 in EPDS. PHQ2 score is 0. Patient answered No to [place] Suicide Severity Rating Scale questions. Patient declines any thoughts of hurting herself or hurting baby at this time. RN told patient that if she has thoughts of harming herself or baby to please come to the ER or call 911. Referral placed to integrated behavioral health. RN notified Dr.Bass or EPDS score, EPDS question 10 answer, PHQ2 score, answering no to [place] Suicide Severity Rating Scale questions, and integrated behavioral health referral.   RN told patient about Maternal Mental Health Resources ([facility], National Maternal Mental Health Hotline, Postpartum Support International). Also told patient about [facility] support group offerings. Patient wrote down information as we were on the phone. Patient states she does not have an e-mail address.    Marcelino Duster [facility] 05/08/2021,1237

## 2021-06-09 ENCOUNTER — Ambulatory Visit (INDEPENDENT_AMBULATORY_CARE_PROVIDER_SITE_OTHER): Payer: Self-pay | Admitting: Licensed Clinical Social Worker

## 2021-06-09 ENCOUNTER — Ambulatory Visit (INDEPENDENT_AMBULATORY_CARE_PROVIDER_SITE_OTHER): Payer: Self-pay | Admitting: Obstetrics & Gynecology

## 2021-06-09 ENCOUNTER — Other Ambulatory Visit: Payer: Self-pay

## 2021-06-09 ENCOUNTER — Encounter: Payer: Self-pay | Admitting: Obstetrics & Gynecology

## 2021-06-09 DIAGNOSIS — R87610 Atypical squamous cells of undetermined significance on cytologic smear of cervix (ASC-US): Secondary | ICD-10-CM

## 2021-06-09 DIAGNOSIS — F4322 Adjustment disorder with anxiety: Secondary | ICD-10-CM

## 2021-06-09 NOTE — Progress Notes (Signed)
Post Partum Visit Note  Debra Eaton is a 36 y.o. [id number] female who presents for a postpartum visit. She is 5 weeks postpartum following a normal spontaneous vaginal delivery.  I have fully reviewed the prenatal and intrapartum course. The delivery was at 38.6 gestational weeks.  Anesthesia: epidural. Postpartum course has been unremarkable. Baby is doing well. Baby is feeding by both breast and bottle - Gerber . Bleeding no bleeding. Bowel function is normal. Bladder function is normal. Patient is not sexually active. Contraception method is IUD. Postpartum depression screening: negative.   The pregnancy intention screening data noted above was reviewed. Potential methods of contraception were discussed. The patient elected to proceed with No data recorded.   Edinburgh Postnatal Depression Scale - 06/09/21 1511       Edinburgh Postnatal Depression Scale:  In the Past 7 Days   I have been able to laugh and see the funny side of things. 1    I have looked forward with enjoyment to things. 0    I have blamed myself unnecessarily when things went wrong. 0    I have been anxious or worried for no good reason. 2    I have felt scared or panicky for no good reason. 0    Things have been getting on top of me. 0    I have been so unhappy that I have had difficulty sleeping. 0    I have felt sad or miserable. 0    I have been so unhappy that I have been crying. 0    The thought of harming myself has occurred to me. 0    Edinburgh Postnatal Depression Scale Total 3             Health Maintenance Due  Topic Date Due   COVID-19 Vaccine (1) Never done   Hepatitis C Screening  Never done    The following portions of the patient's history were reviewed and updated as appropriate: allergies, current medications, past family history, past medical history, past social history, past surgical history, and problem list.  Review of Systems Pertinent items are noted in  HPI.  Objective:  LMP 07/29/2020    General:  alert, cooperative, and no distress   Breasts:    Lungs:   Heart:    Abdomen:    Wound NA  GU exam:  not indicated       Assessment:    There are no diagnoses linked to this encounter.  normal postpartum exam.   Plan:   Essential components of care per ACOG recommendations:  1.  Mood and well being: Patient with negative depression screening today. Reviewed local resources for support.  - Patient tobacco use? No.     2. Infant care and feeding:  -Patient currently breastmilk feeding? Yes. Discussed returning to work and pumping.  -Social determinants of health (SDOH) reviewed in EPIC. No concerns 3. Sexuality, contraception and birth spacing - Patient does not want a pregnancy in the next year.  Desired family size is 5 children.  - Reviewed forms of contraception in tiered fashion. Patient desired IUD today.   - Discussed birth spacing of 18 months  4. Sleep and fatigue -Encouraged family/partner/community support of 4 hrs of uninterrupted sleep to help with mood and fatigue  5. Physical Recovery  - Discussed patients delivery and complications. She describes her labor as good. - Patient had a Vaginal, no problems at delivery. Patient had a 1st degree laceration. Perineal healing reviewed.  Patient expressed understanding - Patient has urinary incontinence? No. - Patient is safe to resume physical and sexual activity  6.  Health Maintenance - HM due items addressed Yes - Last pap smear  Diagnosis  Date Value Ref Range Status  11/14/2020 (A)  Final   - Atypical squamous cells of undetermined significance (ASC-US)   Pap smear not done at today's visit.  -Breast Cancer screening indicated? No.   7. Chronic Disease/Pregnancy Condition follow up: None  - PCP follow up Attestation of Attending Supervision of Advanced Practitioner (CNM/NP/PA): Evaluation and management procedures were performed by the Advanced  Practitioner under my supervision and collaboration.  I have reviewed the Advanced Practitioner's note and chart, and I agree with the management and plan.  Scheryl Darter MD   Center for [employer], [facility]

## 2021-06-10 NOTE — BH Specialist Note (Signed)
Integrated Behavioral Health Initial In-Person Visit  MRN: [id number] Name: Alayia Meggison  Number of Integrated Behavioral Health Clinician visits:: 1/6 Session Start time: 345pm  Session End time: 400pm Total time: 15 minutes in person at [facility]   Types of Service: General Behavioral Integrated Care (BHI)  Interpretor:No. Interpretor Name and Language: none   Warm Hand Off Completed.        Subjective: Mazi Schuff is a 36 y.o. female accompanied by n/a Patient was referred by Dr Donavan Foil for positive depression screen. Patient reports the following symptoms/concerns: adjusting to newborn  Duration of problem: approx 3 weeks ; Severity of problem: mild  Objective: Mood: good  and Affect: Appropriate Risk of harm to self or others: No plan to harm self or others  Life Context: Family and Social: Lives with spouse and five children  School/Work: n/a Self-Care: quiet time Life Changes: Newborn son   Patient and/or Family's Strengths/Protective Factors: Sense of purpose  Goals Addressed: Patient will: Reduce symptoms of: stress Increase knowledge and/or ability of: coping skills and stress reduction  Demonstrate ability to: Increase adequate support systems for patient/family  Progress towards Goals: Ongoing  Interventions: Interventions utilized: Supportive Counseling  Standardized Assessments completed: PHQ 9  Patient and/or Family Response: Ms. Wonda Olds responded well to scheduled appt.   Assessment: Patient currently experiencing adjustment disorder with anxiety.   Patient may benefit from integrated behavioral health.  Plan: Follow up with behavioral health clinician on : prn  Behavioral recommendations: Delegate task to prevent burnout, prioritize rest, engage in stress reducing activity, engage in physical activity  Referral(s): Integrated [employer] (In Clinic) "From scale of 1-10, how likely are you to follow plan?":    Gwyndolyn Saxon, LCSW

## 2021-12-01 ENCOUNTER — Ambulatory Visit: Payer: Self-pay | Admitting: Internal Medicine

## 2022-03-02 ENCOUNTER — Ambulatory Visit: Payer: Self-pay | Admitting: Internal Medicine

## 2022-05-10 NOTE — L&D Delivery Note (Signed)
OB/GYN Faculty Practice Delivery Note  Debra Eaton is a 37 y.o. [id number] s/p SVD at [redacted]w[redacted]d. She was admitted for SOL.   ROM: 2h 45m with thin meconium stained fluid GBS Status:  Negative/-- (10/01 1056) Maximum Maternal Temperature: 99.25F  Labor Progress: Initial SVE: 4/50/-1. She then progressed to complete.   Delivery Date/Time: 10/12 @ 0222 Delivery: Called to room and patient was complete and pushing. Head delivered OA. No nuchal cord present. Shoulder and body delivered in usual fashion. Infant with spontaneous cry, placed on mother's abdomen, dried and stimulated. Cord clamped x 2 after 1-minute delay, and cut by Family Medicine PGY1. Cord blood drawn. Placenta delivered spontaneously with gentle cord traction. Fundus firm with massage and Pitocin. Labia, perineum, vagina, and cervix inspected without laceration requiring suture. Mom and baby doing well.   Baby Weight: pending  Placenta: 3 vessel, intact. Sent to L&D Complications: None Lacerations: none EBL: 55 mL Analgesia: Epidural   Infant:  APGAR (1 MIN):   APGAR (5 MINS):    Hessie Dibble, MD [facility] Family Medicine Fellow, [facility] for [facility], [facility] 02/19/2023, 2:34 AM

## 2022-06-10 ENCOUNTER — Ambulatory Visit: Payer: Self-pay | Admitting: Internal Medicine

## 2022-09-15 ENCOUNTER — Ambulatory Visit: Payer: Self-pay | Admitting: Internal Medicine

## 2022-11-02 ENCOUNTER — Ambulatory Visit (INDEPENDENT_AMBULATORY_CARE_PROVIDER_SITE_OTHER): Payer: Self-pay | Admitting: *Deleted

## 2022-11-02 VITALS — BP 107/70 | HR 69 | Wt 182.0 lb

## 2022-11-02 DIAGNOSIS — Z3480 Encounter for supervision of other normal pregnancy, unspecified trimester: Secondary | ICD-10-CM | POA: Insufficient documentation

## 2022-11-02 DIAGNOSIS — Z1339 Encounter for screening examination for other mental health and behavioral disorders: Secondary | ICD-10-CM

## 2022-11-02 DIAGNOSIS — Z3A22 22 weeks gestation of pregnancy: Secondary | ICD-10-CM

## 2022-11-02 DIAGNOSIS — Z3482 Encounter for supervision of other normal pregnancy, second trimester: Secondary | ICD-10-CM

## 2022-11-02 NOTE — Progress Notes (Signed)
New OB Intake  I connected with Debra Eaton  on 11/02/22 at  2:10 PM EDT by In Person Visit and verified that I am speaking with the correct person using two identifiers. Nurse is located at [facility] and pt is located at [facility].  I discussed the limitations, risks, security and privacy concerns of performing an evaluation and management service by telephone and the availability of in person appointments. I also discussed with the patient that there may be a patient responsible charge related to this service. The patient expressed understanding and agreed to proceed.  I explained I am completing New OB Intake today. We discussed EDD of 03/07/2023, by Last Menstrual Period. Pt is [id number]. I reviewed her allergies, medications and Medical/Surgical/OB history.    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. Discussed the nature of our practice with multiple providers including residents and students. Due to the size of the practice, the delivering provider may not be the same as those providing prenatal care.   Patient is not interested in water birth. Offered upcoming OB visit with CNM to discuss further.  MyChart/Babyscripts MyChart access verified. I explained pt will have some visits in office and some virtually. Babyscripts instructions given and order placed. Patient verifies receipt of registration text/e-mail. Account successfully created and app downloaded.  Blood Pressure Cuff/Weight Scale Blood pressure cuff ordered for patient to pick-up from [employer]. Explained after first prenatal appt pt will check weekly and document in Babyscripts. Patient does  not have weight scale; patient may purchase if they desire to track weight weekly in Babyscripts.  Anatomy US Explained first scheduled [place] will be around 19 weeks. Anatomy US scheduled for TBD at [facility].  Interested in [place]? If yes, send referral and doula dot phrase.    First visit review I reviewed new OB appt with patient. Explained pt will be seen by Albertine Grates, NP at first visit. Discussed Avelina Laine genetic screening with patient. Requested [employer] and [employer]. ([employer] form completed and sent with speciman) Routine prenatal labs  collected at today's visit    Last Pap Diagnosis  Date Value Ref Range Status  11/14/2020 (A)  Final   - Atypical squamous cells of undetermined significance (ASC-US)    Harrel Lemon, RN 11/02/2022  3:11 PM

## 2022-11-02 NOTE — Patient Instructions (Signed)
[facility] has a partnership with the [employer] to provide prenatal navigation for the most needed resources in our community. In order to see how we can help connect you to these resources we need consent to contact you. Please complete the very short consent using the link below:   English Link: [email or web address]?[email or web address]  Spanish Link: [email or web address].[email or web address]?[email or web address]  

## 2022-11-03 ENCOUNTER — Other Ambulatory Visit (HOSPITAL_COMMUNITY)
Admission: RE | Admit: 2022-11-03 | Discharge: 2022-11-03 | Disposition: A | Discharge status: Home / Self Care | Payer: Self-pay | Source: Ambulatory Visit | Attending: Obstetrics and Gynecology | Admitting: Obstetrics and Gynecology

## 2022-11-03 ENCOUNTER — Ambulatory Visit (INDEPENDENT_AMBULATORY_CARE_PROVIDER_SITE_OTHER): Payer: Self-pay | Admitting: Obstetrics and Gynecology

## 2022-11-03 VITALS — BP 108/68 | HR 70 | Wt 183.0 lb

## 2022-11-03 DIAGNOSIS — O09522 Supervision of elderly multigravida, second trimester: Secondary | ICD-10-CM

## 2022-11-03 DIAGNOSIS — Z3A22 22 weeks gestation of pregnancy: Secondary | ICD-10-CM

## 2022-11-03 DIAGNOSIS — Z3480 Encounter for supervision of other normal pregnancy, unspecified trimester: Secondary | ICD-10-CM

## 2022-11-03 DIAGNOSIS — O99282 Endocrine, nutritional and metabolic diseases complicating pregnancy, second trimester: Secondary | ICD-10-CM

## 2022-11-03 DIAGNOSIS — E039 Hypothyroidism, unspecified: Secondary | ICD-10-CM | POA: Insufficient documentation

## 2022-11-03 DIAGNOSIS — Z8279 Family history of other congenital malformations, deformations and chromosomal abnormalities: Secondary | ICD-10-CM

## 2022-11-03 LAB — CBC/D/PLT+RPR+RH+ABO+RUBIGG...
Antibody Screen: NEGATIVE
Basophils Absolute: 0 10*3/uL (ref 0.0–0.2)
Basos: 0 %
EOS (ABSOLUTE): 0.1 10*3/uL (ref 0.0–0.4)
Eos: 2 %
HCV Ab: NONREACTIVE
HIV Screen 4th Generation wRfx: NONREACTIVE
Hematocrit: 31.8 % — ABNORMAL LOW (ref 34.0–46.6)
Hemoglobin: 10.6 g/dL — ABNORMAL LOW (ref 11.1–15.9)
Hepatitis B Surface Ag: NEGATIVE
Immature Grans (Abs): 0 10*3/uL (ref 0.0–0.1)
Immature Granulocytes: 0 %
Lymphocytes Absolute: 1.7 10*3/uL (ref 0.7–3.1)
Lymphs: 20 %
MCH: 27.8 pg (ref 26.6–33.0)
MCHC: 33.3 g/dL (ref 31.5–35.7)
MCV: 84 fL (ref 79–97)
Monocytes Absolute: 0.7 10*3/uL (ref 0.1–0.9)
Monocytes: 9 %
Neutrophils Absolute: 6 10*3/uL (ref 1.4–7.0)
Neutrophils: 69 %
Platelets: 254 10*3/uL (ref 150–450)
RBC: 3.81 x10E6/uL (ref 3.77–5.28)
RDW: 14.9 % (ref 11.7–15.4)
RPR Ser Ql: NONREACTIVE
Rh Factor: POSITIVE
Rubella Antibodies, IGG: 24.4 index (ref >0.99)
WBC: 8.6 10*3/uL (ref 3.4–10.8)

## 2022-11-03 LAB — HEMOGLOBIN A1C
Est. average glucose Bld gHb Est-mCnc: 114 mg/dL
Hgb A1c MFr Bld: 5.6 % (ref 4.8–5.6)

## 2022-11-03 LAB — HCV INTERPRETATION

## 2022-11-03 LAB — TSH: TSH: 2.82 u[IU]/mL (ref 0.450–4.500)

## 2022-11-03 MED ORDER — ASPIRIN 81 MG PO TBEC: 81.0000 mg | DELAYED_RELEASE_TABLET | Freq: Every day | ORAL | 2 refills | Status: DC

## 2022-11-03 NOTE — Progress Notes (Signed)
Pt presents for NOB visit. Requesting PAP deferred PP. Refused gc/cc today. No concerns.

## 2022-11-03 NOTE — Progress Notes (Signed)
INITIAL PRENATAL VISIT  Subjective:   Debra Eaton is being seen today for her first obstetrical visit.  This is not a planned pregnancy. This is a desired pregnancy.  She is at [redacted]w[redacted]d gestation by LMP. Her obstetrical history is significant for advanced maternal age. Relationship with FOB: significant other, living together. Patient does intend to breast feed. Pregnancy history fully reviewed.  Patient reports no complaints.  Indications for ASA therapy (per uptodate) One of the following: Previous pregnancy with preeclampsia, especially early onset and with an adverse outcome No Multifetal gestation No Chronic hypertension No Type 1 or 2 diabetes mellitus No Chronic kidney disease No Autoimmune disease (antiphospholipid syndrome, systemic lupus erythematosus) Yes   Objective:    Obstetric History OB History  Gravida Para Term Preterm AB Living  7 5 5  0 1 5  SAB IAB Ectopic Multiple Live Births  1 0 0 0 5    # Outcome Date GA Lbr Len/2nd Weight Sex Delivery Anes PTL Lv  7 Current           6 Term 04/27/21 [redacted]w[redacted]d / 00:42 8 lb 14.5 oz (4.04 kg) M Vag-Spont EPI  LIV     Birth Comments: wnl  5 Term 06/22/13 [redacted]w[redacted]d 06:05 / 00:15 7 lb 15.9 oz (3.625 kg) F Vag-Spont EPI  LIV     Birth Comments: polydactally  4 Term 06/20/07    M Vag-Spont   LIV  3 Term 12/22/06    F Vag-Spont   LIV  2 SAB 2008          1 Term 04/08/04    F Vag-Spont   LIV    Past Medical History:  Diagnosis Date   Hypothyroidism     Past Surgical History:  Procedure Laterality Date   NO PAST SURGERIES      Current Outpatient Medications on File Prior to Visit  Medication Sig Dispense Refill   levothyroxine (SYNTHROID) 75 MCG tablet Take 75 mcg by mouth daily before breakfast.     Prenatal Vit-Fe Fumarate-FA (MULTIVITAMIN-PRENATAL) 27-0.8 MG TABS tablet Take 1 tablet by mouth daily at 12 noon.     acetaminophen (TYLENOL) 325 MG tablet Take 2 tablets (650 mg total) by mouth every 4 (four)  hours as needed (for pain scale < 4). (Patient not taking: Reported on 06/09/2021) 60 tablet 0   ibuprofen (ADVIL) 600 MG tablet Take 1 tablet (600 mg total) by mouth every 6 (six) hours. (Patient not taking: Reported on 06/09/2021) 30 tablet 0   No current facility-administered medications on file prior to visit.    No Known Allergies  Social History:  reports that she has never smoked. She has never used smokeless tobacco. She reports current alcohol use. She reports that she does not use drugs.  Family History  Problem Relation Age of Onset   Diabetes Mother    Heart disease Father    Kidney disease Father    Liver disease Father     The following portions of the patient's history were reviewed and updated as appropriate: allergies, current medications, past family history, past medical history, past social history, past surgical history and problem list.  Review of Systems Review of Systems  All other systems reviewed and are negative.     Physical Exam:  BP 108/68   Pulse 70   Wt 183 lb (83 kg)   LMP 05/31/2022   BMI 31.41 kg/m  CONSTITUTIONAL: Well-developed, well-nourished female in no acute distress.  HENT:  Normocephalic, atraumatic.  Oropharynx is clear and moist EYES: Conjunctivae normal. No scleral icterus.  NECK: Normal range of motion, supple, no masses.  Normal thyroid.  SKIN: Skin is warm and dry. No rash noted. Not diaphoretic. No erythema. No pallor. MUSCULOSKELETAL: Normal range of motion.  NEUROLOGIC: Alert and oriented to person, place, and time. Normal muscle tone coordination.  PSYCHIATRIC: Normal mood and affect. Normal behavior. Normal judgment and thought content. CARDIOVASCULAR: Normal heart rate noted, regular rhythm RESPIRATORY: normal effort  ABDOMEN: Soft PELVIC: deferred      Movement: Present       Assessment:    Pregnancy: G7P5015  1. Supervision of other normal pregnancy, antepartum BP and FHR normal Feeling regular  movement Overwhelmed that she is pregnant again, was not planning. Ok with pregnancy, expressed we are here to help with whatever she needs. Considering BTL vs vasectomy   2. Multigravida of advanced maternal age in second trimester Start ASA Ordered anatomy scan   3. Family history of spina bifida 46 year old daughter has spina bifida AFP pending   4. Hypothyroidism, unspecified type On Levothyroxine 75 mcg daily.  Sees endocrinologist, last seen two weeks ago. Request records today  5. [redacted] weeks gestation of pregnancy Anticipatory guidance regarding GTT and labs next visit      Plan:     Initial labs reviewed Prenatal vitamins. Problem list reviewed and updated. Reviewed in detail the nature of the practice with collaborative care between  Genetic screening discussed: NIPS/First trimester screen/Quad/AFP ordered. Role of ultrasound in pregnancy discussed; Anatomy US: ordered.  Follow up in 4 weeks. Discussed clinic routines, schedule of care and testing, genetic screening options, involvement of students and residents under the direct supervision of APPs and doctors and presence of female providers. Pt verbalized understanding.  Future Appointments  Date Time Provider Department Center  11/17/2022 11:00 AM Julieanne Manson, MD [facility] None  11/29/2022  9:00 AM [facility] None  11/29/2022  9:35 AM Lennart Pall, MD [facility] None    Sue Lush, FNP

## 2022-11-04 LAB — AFP, SERUM, OPEN SPINA BIFIDA
AFP MoM: 0.97
AFP Value: 66.7 ng/mL
Gest. Age on Collection Date: 22.1 weeks
Maternal Age At EDD: 38.2 yr
OSBR Risk 1 IN: 10000
Test Results:: NEGATIVE
Weight: 182 [lb_av]

## 2022-11-04 LAB — CERVICOVAGINAL ANCILLARY ONLY
Bacterial Vaginitis (gardnerella): POSITIVE — AB
Candida Glabrata: NEGATIVE
Candida Vaginitis: NEGATIVE
Chlamydia: NEGATIVE
Comment: NEGATIVE
Comment: NEGATIVE
Comment: NEGATIVE
Comment: NEGATIVE
Comment: NEGATIVE
Comment: NORMAL
Neisseria Gonorrhea: NEGATIVE
Trichomonas: NEGATIVE

## 2022-11-04 LAB — CULTURE, OB URINE

## 2022-11-04 LAB — URINE CULTURE, OB REFLEX

## 2022-11-04 NOTE — Addendum Note (Signed)
Addended by: Sue Lush on: 11/04/2022 11:06 PM   Modules accepted: Orders

## 2022-11-05 ENCOUNTER — Other Ambulatory Visit: Payer: Self-pay

## 2022-11-05 DIAGNOSIS — N760 Acute vaginitis: Secondary | ICD-10-CM

## 2022-11-05 MED ORDER — METRONIDAZOLE 500 MG PO TABS: 500.0000 mg | ORAL_TABLET | Freq: Two times a day (BID) | ORAL | 0 refills | Status: DC

## 2022-11-08 LAB — HORIZON CUSTOM

## 2022-11-12 LAB — PANORAMA PRENATAL TEST FULL PANEL:PANORAMA TEST PLUS 5 ADDITIONAL MICRODELETIONS: FETAL FRACTION: 15.5

## 2022-11-17 ENCOUNTER — Ambulatory Visit: Payer: Self-pay | Admitting: Internal Medicine

## 2022-11-29 ENCOUNTER — Other Ambulatory Visit: Payer: Self-pay

## 2022-11-29 ENCOUNTER — Ambulatory Visit (INDEPENDENT_AMBULATORY_CARE_PROVIDER_SITE_OTHER): Payer: Self-pay | Admitting: Obstetrics and Gynecology

## 2022-11-29 ENCOUNTER — Ambulatory Visit (INDEPENDENT_AMBULATORY_CARE_PROVIDER_SITE_OTHER): Payer: Self-pay | Admitting: Licensed Clinical Social Worker

## 2022-11-29 VITALS — BP 97/67 | HR 71 | Wt 184.5 lb

## 2022-11-29 DIAGNOSIS — Z3A26 26 weeks gestation of pregnancy: Secondary | ICD-10-CM

## 2022-11-29 DIAGNOSIS — Z23 Encounter for immunization: Secondary | ICD-10-CM

## 2022-11-29 DIAGNOSIS — Z3480 Encounter for supervision of other normal pregnancy, unspecified trimester: Secondary | ICD-10-CM

## 2022-11-29 DIAGNOSIS — F4322 Adjustment disorder with anxiety: Secondary | ICD-10-CM

## 2022-11-29 DIAGNOSIS — Z8279 Family history of other congenital malformations, deformations and chromosomal abnormalities: Secondary | ICD-10-CM

## 2022-11-29 DIAGNOSIS — O24419 Gestational diabetes mellitus in pregnancy, unspecified control: Secondary | ICD-10-CM

## 2022-11-29 DIAGNOSIS — O09522 Supervision of elderly multigravida, second trimester: Secondary | ICD-10-CM

## 2022-11-29 DIAGNOSIS — O09523 Supervision of elderly multigravida, third trimester: Secondary | ICD-10-CM | POA: Insufficient documentation

## 2022-11-29 DIAGNOSIS — E039 Hypothyroidism, unspecified: Secondary | ICD-10-CM

## 2022-11-29 DIAGNOSIS — O99282 Endocrine, nutritional and metabolic diseases complicating pregnancy, second trimester: Secondary | ICD-10-CM

## 2022-11-29 NOTE — Progress Notes (Signed)
Pt presents for ROB. 28 wk labs, gtt drawn today. Desires Tdap

## 2022-11-29 NOTE — Progress Notes (Signed)
   PRENATAL VISIT NOTE  Subjective:  Debra Eaton is a 37 y.o. N5A2130 at 100w0d being seen today for ongoing prenatal care.  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  Patient reports no complaints.  Contractions: Not present. Vag. Bleeding: None.  Movement: Present. Denies leaking of fluid.   The following portions of the patient's history were reviewed and updated as appropriate: allergies, current medications, past family history, past medical history, past social history, past surgical history and problem list.   Objective:   Vitals:   11/29/22 0934  BP: 97/67  Pulse: 71  Weight: 184 lb 8 oz (83.7 kg)    Fetal Status:     Movement: Present     General:  Alert, oriented and cooperative. Patient is in no acute distress.  Skin: Skin is warm and dry. No rash noted.   Cardiovascular: Normal heart rate noted  Respiratory: Normal respiratory effort, no problems with respiration noted  Abdomen: Soft, gravid, appropriate for gestational age.  Pain/Pressure: Absent      Assessment and Plan:  Pregnancy: G7P5015 at [redacted]w[redacted]d 1. Supervision of other normal pregnancy, antepartum 2. [redacted] weeks gestation of pregnancy - Glucose Tolerance, 2 Hours w/1 Hour - CBC - HIV Antibody (routine testing w rflx) - RPR  3. Hypothyroidism affecting pregnancy in second trimester Continue synthroid Last TSH 6/25 2.82 Will repeat TSH in third trimester  4. Family history of spina bifida Pt's daughter  Outside of window for high dose folate supplementation, continue pnv AFP negative Anatomy US w/ possible LGA at [facility], has repeat scheduled  5. AMA ldASA LR NIPS, normal  anatomy  6. Interested in BTL Sign papers next appt  Please refer to After Visit Summary for other counseling recommendations.   Return in about 4 weeks (around 12/27/2022) for return OB at 30 weeks.  Future Appointments  Date Time Provider Department Center  11/29/2022 10:00 AM Gwyndolyn Saxon, LCSW [facility] None  03/07/2023  2:00 PM Julieanne Manson, MD [facility] None   Lennart Pall, MD

## 2022-11-30 DIAGNOSIS — O24419 Gestational diabetes mellitus in pregnancy, unspecified control: Secondary | ICD-10-CM | POA: Insufficient documentation

## 2022-11-30 LAB — GLUCOSE TOLERANCE, 2 HOURS W/ 1HR
Glucose, 1 hour: 201 mg/dL — ABNORMAL HIGH (ref 70–179)
Glucose, 2 hour: 159 mg/dL — ABNORMAL HIGH (ref 70–152)
Glucose, Fasting: 80 mg/dL (ref 70–91)

## 2022-11-30 LAB — CBC
Hematocrit: 32.3 % — ABNORMAL LOW (ref 34.0–46.6)
Hemoglobin: 10.5 g/dL — ABNORMAL LOW (ref 11.1–15.9)
MCH: 27.2 pg (ref 26.6–33.0)
MCHC: 32.5 g/dL (ref 31.5–35.7)
MCV: 84 fL (ref 79–97)
Platelets: 249 10*3/uL (ref 150–450)
RBC: 3.86 x10E6/uL (ref 3.77–5.28)
RDW: 15.8 % — ABNORMAL HIGH (ref 11.7–15.4)
WBC: 7.4 10*3/uL (ref 3.4–10.8)

## 2022-11-30 LAB — RPR: RPR Ser Ql: NONREACTIVE

## 2022-11-30 LAB — HIV ANTIBODY (ROUTINE TESTING W REFLEX): HIV Screen 4th Generation wRfx: NONREACTIVE

## 2022-11-30 NOTE — Addendum Note (Signed)
Addended by: Harvie Bridge on: 11/30/2022 05:21 PM   Modules accepted: Orders

## 2022-12-01 ENCOUNTER — Telehealth: Payer: Self-pay

## 2022-12-01 ENCOUNTER — Other Ambulatory Visit: Payer: Self-pay

## 2022-12-01 DIAGNOSIS — O24419 Gestational diabetes mellitus in pregnancy, unspecified control: Secondary | ICD-10-CM

## 2022-12-01 MED ORDER — ACCU-CHEK GUIDE VI STRP: ORAL_STRIP | 12 refills | Status: DC

## 2022-12-01 MED ORDER — ACCU-CHEK GUIDE W/DEVICE KIT: 1.0000 | PACK | Freq: Four times a day (QID) | 0 refills | Status: DC

## 2022-12-01 MED ORDER — ACCU-CHEK SOFTCLIX LANCETS MISC: 12 refills | Status: DC

## 2022-12-01 NOTE — Telephone Encounter (Signed)
I connected with  Debra Eaton on 12/01/22 by telephone and verified that I am speaking with the correct person using two identifiers.   Pt informed of failed 2 hr GTT. Pt informed referral to diabetes educator has been placed and supplies have been ordered. Pt states she has received a call from the diabetes educator but is working on getting time away from work.

## 2022-12-01 NOTE — Telephone Encounter (Signed)
-----   Message from Lennart Pall sent at 11/30/2022  5:21 PM EDT ----- Pt w/ GDM - mychart not set up. Can we please notify pt of diagnosis and arrange appointment with diabetes educator? Referral placed. Remainder of her labs are normal. Thanks -KF

## 2022-12-06 NOTE — BH Specialist Note (Signed)
Integrated Behavioral Health Initial In-Person Visit  MRN: [id number] Name: Debra Eaton  Number of Integrated Behavioral Health Clinician visits: 1 Session Start time:   10:00AM Session End time: 10:15AM Total time in minutes: 15 Mins in person at [facility]   Types of Service: General Behavioral Integrated Care (BHI)  Interpretor:No. Interpretor Name and Language: none   Warm Hand Off Completed.        Subjective: Debra Eaton is a 37 y.o. female accompanied by n/a Patient was referred by Marveen Reeks MD for situational stress. Patient reports the following symptoms/concerns: stress  Duration of problem: approx 3 months; Severity of problem: mild  Objective: Mood: good  and Affect: Appropriate Risk of harm to self or others: No plan to harm self or others  Life Context: Family and Social: Lives with spouse and children School/Work: works 3 days a weeks  Self-Care: n/a Life Changes: New pregnancy  Patient and/or Family's Strengths/Protective Factors: Concrete supports in place (healthy food, safe environments, etc.)  Goals Addressed: Patient will: Reduce symptoms of: stress Increase knowledge and/or ability of: stress reduction  Demonstrate ability to: Increase adequate support systems for patient/family  Progress towards Goals: Ongoing  Interventions: Interventions utilized: Motivational Interviewing  Standardized Assessments completed: PHQ 9  Patient and/or Family Response: Debra Eaton welcomes new pregnancy however she reports worry due to youngest child is approximately two years old.   Assessment: Patient currently experiencing adjustment disorder with anxiety.   Patient may benefit from integrated behavioral health.  Plan: Follow up with behavioral health clinician on : as needed  Behavioral recommendations: delegate task, prioritize rest, and stress reducing activity Referral(s): Integrated [employer] (In  Clinic) "From scale of 1-10, how likely are you to follow plan?":    Gwyndolyn Saxon, LCSW

## 2022-12-15 ENCOUNTER — Ambulatory Visit: Payer: Self-pay | Admitting: Dietician

## 2022-12-27 ENCOUNTER — Ambulatory Visit (INDEPENDENT_AMBULATORY_CARE_PROVIDER_SITE_OTHER): Payer: Self-pay | Admitting: Obstetrics and Gynecology

## 2022-12-27 ENCOUNTER — Encounter: Payer: Self-pay | Admitting: Obstetrics and Gynecology

## 2022-12-27 VITALS — BP 97/61 | HR 69 | Wt 182.0 lb

## 2022-12-27 DIAGNOSIS — Z8279 Family history of other congenital malformations, deformations and chromosomal abnormalities: Secondary | ICD-10-CM

## 2022-12-27 DIAGNOSIS — E039 Hypothyroidism, unspecified: Secondary | ICD-10-CM

## 2022-12-27 DIAGNOSIS — Z3A30 30 weeks gestation of pregnancy: Secondary | ICD-10-CM

## 2022-12-27 DIAGNOSIS — O09523 Supervision of elderly multigravida, third trimester: Secondary | ICD-10-CM

## 2022-12-27 DIAGNOSIS — O99283 Endocrine, nutritional and metabolic diseases complicating pregnancy, third trimester: Secondary | ICD-10-CM

## 2022-12-27 DIAGNOSIS — Z3480 Encounter for supervision of other normal pregnancy, unspecified trimester: Secondary | ICD-10-CM

## 2022-12-27 DIAGNOSIS — O24410 Gestational diabetes mellitus in pregnancy, diet controlled: Secondary | ICD-10-CM

## 2022-12-27 NOTE — Progress Notes (Signed)
   PRENATAL VISIT NOTE  Subjective:  Debra Eaton is a 37 y.o. [id number] at [redacted]w[redacted]d being seen today for ongoing prenatal care.  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  Patient reports no complaints.  Contractions: Not present. Vag. Bleeding: None.  Movement: Present. Denies leaking of fluid.   The following portions of the patient's history were reviewed and updated as appropriate: allergies, current medications, past family history, past medical history, past social history, past surgical history and problem list.   Objective:   Vitals:   12/27/22 0852  BP: 97/61  Pulse: 69  Weight: 182 lb (82.6 kg)    Fetal Status: Fetal Heart Rate (bpm): 139 Fundal Height: 32 cm Movement: Present     General:  Alert, oriented and cooperative. Patient is in no acute distress.  Skin: Skin is warm and dry. No rash noted.   Cardiovascular: Normal heart rate noted  Respiratory: Normal respiratory effort, no problems with respiration noted  Abdomen: Soft, gravid, appropriate for gestational age.  Pain/Pressure: Absent     Pelvic: Cervical exam deferred        Extremities: Normal range of motion.  Edema: None  Mental Status: Normal mood and affect. Normal behavior. Normal judgment and thought content.   Assessment and Plan:  Pregnancy: A5W0981 at [redacted]w[redacted]d 1. Supervision of other normal pregnancy, antepartum Patient is doing well without complaints Desires BTL and is aware of need for pre-payment  2. Hypothyroidism affecting pregnancy in third trimester Continue on synthroid which patient admits she stopped taking last week but plans to restart  today  3. Diet controlled gestational diabetes mellitus (GDM) in third trimester Patient missed diabetes education appointment- we will reschedule today The implications and management of diabetes in pregnancy  was discussed in detail with the patient. She was advised that  our goals for her blood glucose values are fasting values of  90-95 or less and two-hour postprandials of 120 or less. Should her blood glucose values be above these values, her insulin regimen should be adjusted to help her achieve better  glycemic control. The patient was advised that getting her  fingerstick values as close to these goals as possible would  provide her with the most optimal obstetrical outcome.  LGA on July ultrasound- follow up growth ultrasound ordered   4. AMA (advanced maternal age) multigravida 35+, third trimester   5. Family history of spina bifida Normal AFP  Preterm labor symptoms and general obstetric precautions including but not limited to vaginal bleeding, contractions, leaking of fluid and fetal movement were reviewed in detail with the patient. Please refer to After Visit Summary for other counseling recommendations.   Return in about 2 weeks (around 01/10/2023) for in person, ROB, High risk.  Future Appointments  Date Time Provider Department Center  03/07/2023  2:00 PM Julieanne Manson, MD [facility] None    Catalina Antigua, MD

## 2023-01-11 ENCOUNTER — Ambulatory Visit (INDEPENDENT_AMBULATORY_CARE_PROVIDER_SITE_OTHER): Payer: Self-pay | Admitting: Obstetrics and Gynecology

## 2023-01-11 VITALS — BP 107/69 | HR 64 | Wt 185.0 lb

## 2023-01-11 DIAGNOSIS — Z603 Acculturation difficulty: Secondary | ICD-10-CM

## 2023-01-11 DIAGNOSIS — Z3A32 32 weeks gestation of pregnancy: Secondary | ICD-10-CM

## 2023-01-11 DIAGNOSIS — O99283 Endocrine, nutritional and metabolic diseases complicating pregnancy, third trimester: Secondary | ICD-10-CM

## 2023-01-11 DIAGNOSIS — O09523 Supervision of elderly multigravida, third trimester: Secondary | ICD-10-CM

## 2023-01-11 DIAGNOSIS — Z3480 Encounter for supervision of other normal pregnancy, unspecified trimester: Secondary | ICD-10-CM

## 2023-01-11 DIAGNOSIS — Z758 Other problems related to medical facilities and other health care: Secondary | ICD-10-CM

## 2023-01-11 DIAGNOSIS — Z302 Encounter for sterilization: Secondary | ICD-10-CM

## 2023-01-11 DIAGNOSIS — E039 Hypothyroidism, unspecified: Secondary | ICD-10-CM

## 2023-01-11 DIAGNOSIS — O24419 Gestational diabetes mellitus in pregnancy, unspecified control: Secondary | ICD-10-CM

## 2023-01-11 NOTE — Progress Notes (Signed)
   PRENATAL VISIT NOTE  Subjective:  Debra Eaton is a 37 y.o. G4W1027 at [redacted]w[redacted]d being seen today for ongoing prenatal care.  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  Patient reports  doing ok overall .  Contractions: Not present. Vag. Bleeding: None.  Movement: Present. Denies leaking of fluid.   The following portions of the patient's history were reviewed and updated as appropriate: allergies, current medications, past family history, past medical history, past social history, past surgical history and problem list.   Objective:   Vitals:   01/11/23 0844  BP: 107/69  Pulse: 64  Weight: 185 lb (83.9 kg)    Fetal Status: Fetal Heart Rate (bpm): 140   Movement: Present     General:  Alert, oriented and cooperative. Patient is in no acute distress.  Skin: Skin is warm and dry. No rash noted.   Cardiovascular: Normal heart rate noted  Respiratory: Normal respiratory effort, no problems with respiration noted  Abdomen: Soft, gravid, appropriate for gestational age.  Pain/Pressure: Present      Assessment and Plan:  Pregnancy: G7P5015 at [redacted]w[redacted]d 1. Supervision of other normal pregnancy, antepartum 2. [redacted] weeks gestation of pregnancy  3. Gestational diabetes mellitus (GDM) in third trimester, gestational diabetes method of control unspecified Has not picked up glucometer, does not have BG log for review We discussed importance of checking BG. Reviewed implications for uncontrolled BG. She plans to pick up her monitor today and BG logs were provided to the patient Growth [place] ordered for [facility] pt has not  had [place] since 6/28 Reviewed delivery by 39 weeks, but may need 37 week delivery if unable to complete outpatient monitoring or LGA  4. Hypothyroidism affecting pregnancy in third trimester Synthroid daily, pt resumed her medication after last appt - TSH  5. AMA (advanced maternal age) multigravida 35+, third trimester ldASA LR NIPS, normal anatomy  6. Request for sterilization MA-31 signed, will need further counseling. Not addressed today due to significant counseling about GDM  7. Language barrier Interviewed and counseled with in person Spanish interpreter  Please refer to After Visit Summary for other counseling recommendations.   Return in about 2 weeks (around 01/25/2023) for return OB at 34 weeks - discuss tubal.  Future Appointments  Date Time Provider Department Center  01/25/2023  8:35 AM Warden Fillers, MD [facility] None  03/07/2023  2:00 PM Julieanne Manson, MD [facility] None    Lennart Pall, MD

## 2023-01-11 NOTE — Progress Notes (Signed)
Pt states she is occ checking sugars at home.  Pt does not have readings but states normal readings.  Last u/s 6/28 with [facility].

## 2023-01-12 LAB — TSH: TSH: 3.53 u[IU]/mL (ref 0.450–4.500)

## 2023-01-25 ENCOUNTER — Ambulatory Visit (INDEPENDENT_AMBULATORY_CARE_PROVIDER_SITE_OTHER): Payer: Self-pay | Admitting: Obstetrics and Gynecology

## 2023-01-25 VITALS — BP 106/70 | HR 67 | Wt 186.0 lb

## 2023-01-25 DIAGNOSIS — Z302 Encounter for sterilization: Secondary | ICD-10-CM

## 2023-01-25 DIAGNOSIS — E039 Hypothyroidism, unspecified: Secondary | ICD-10-CM

## 2023-01-25 DIAGNOSIS — O09523 Supervision of elderly multigravida, third trimester: Secondary | ICD-10-CM

## 2023-01-25 DIAGNOSIS — O24410 Gestational diabetes mellitus in pregnancy, diet controlled: Secondary | ICD-10-CM

## 2023-01-25 DIAGNOSIS — Z3480 Encounter for supervision of other normal pregnancy, unspecified trimester: Secondary | ICD-10-CM

## 2023-01-25 DIAGNOSIS — O99283 Endocrine, nutritional and metabolic diseases complicating pregnancy, third trimester: Secondary | ICD-10-CM

## 2023-01-25 DIAGNOSIS — Z3A34 34 weeks gestation of pregnancy: Secondary | ICD-10-CM

## 2023-01-25 DIAGNOSIS — Z8279 Family history of other congenital malformations, deformations and chromosomal abnormalities: Secondary | ICD-10-CM

## 2023-01-25 NOTE — Progress Notes (Signed)
   PRENATAL VISIT NOTE  Subjective:  Debra Eaton is a 37 y.o. G3O7564 at [redacted]w[redacted]d being seen today for ongoing prenatal care.  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  Patient doing well with no acute concerns today. She reports  intermittent midepigastric pain which improves after vomiting .  Contractions: Not present. Vag. Bleeding: None.  Movement: Present. Denies leaking of fluid.   The following portions of the patient's history were reviewed and updated as appropriate: allergies, current medications, past family history, past medical history, past social history, past surgical history and problem list. Problem list updated.  Patient counseled regarding bilateral tubal ligation. Reviewed that this is a permanent procedure and that she will not be able to have children after it is done. Reviewed risks of bilateral tubal ligation including infection, hemorrhage, damage to surrounding tissue and organs, risk of regret. Reviewed that bilateral tubal ligation is not 100% effective and she should take a pregnancy test if she believes for any reason she may be pregnant. Reviewed slightly increased risk of ectopic pregnancy and need to seek care if she becomes pregnant. She understands this is an elective procedure and again affirms her desire.   Also discussed vasectomy, but pt states her spouse is extremely resistant to the idea    Objective:   Vitals:   01/25/23 0905  BP: 106/70  Pulse: 67  Weight: 186 lb (84.4 kg)    Fetal Status: Fetal Heart Rate (bpm): 147 Fundal Height: 35 cm Movement:  Present     General:  Alert, oriented and cooperative. Patient is in no acute distress.  Skin: Skin is warm and dry. No rash noted.   Cardiovascular: Normal heart rate noted  Respiratory: Normal respiratory effort, no problems with respiration noted  Abdomen: Soft, gravid, appropriate for gestational age.  Pain/Pressure: Present     Pelvic: Cervical exam deferred        Extremities: Normal range of motion.  Edema: None  Mental Status:  Normal mood and affect. Normal behavior. Normal judgment and thought content.   Assessment and Plan:  Pregnancy: G7P5015 at [redacted]w[redacted]d  1. [redacted] weeks gestation of pregnancy   2. Diet controlled gestational diabetes mellitus (GDM) in third trimester Did not review blood sugar today  3. Hypothyroidism affecting pregnancy in third trimester Pt continues with synthroid  4. AMA (advanced maternal age) multigravida 35+, third trimester   5. Family history of spina bifida AFP testing was normal  6. Request for sterilization See above.  Pt is aware of BTL cost.  Consent previously signed.  At this time she still desires tubal ligation  7. Supervision of other normal pregnancy, antepartum Continue routine prenatal care  Preterm labor symptoms and general obstetric precautions including but not limited to vaginal bleeding, contractions, leaking of fluid and fetal movement were reviewed in detail with the patient.  Please refer to After Visit Summary for other counseling recommendations.   Return in about 2 weeks (around 02/08/2023) for ROB, in person, 36 weeks swabs.   Mariel Aloe, MD Faculty Attending Center for [facility]

## 2023-01-25 NOTE — Patient Instructions (Signed)
Tubal Ligation with C/S or Postpartum   With C/S: $1170  Postpartum: $1280 + anesthesia   Patient needs to inform staff of desire for tubal ligation as early as possible in the pregnancy  Pre-payment is required for procedure to be scheduled  Payment plans are available upon request   Patient will need to call [facility] (Anesthesia) at [phone] for estimate   Ligadura de trompas con cesrea o despus del parto Con cesrea: $1170 Despus del parto: $1280 ms la anestesia La paciente debe informarle al personal de su deseo de hacerse la ligadura de trompas tan pronto  como le sea posible durante el [place]. Se requiere el pago por adelantado para que el procedimiento pueda programarse. Los planes de pago estn disponibles por solicitud. La paciente debe llamar a [facility] Janann August) al [phone] para pedir la cotizacin.   Outpatient tubal ligation   Estimated out-of-pocket total for patient (does NOT include anesthesia)  Procedure fee + Facility fee  Salpingectomy: $855 + $5835.90 = $6690.90 [place]: $497.80 + $5835.90 = $6333.70 [place]: $497.80 + $5835.90 = $3664.40  Patient needs to inform staff of desire for tubal ligation Pre-payment of procedure fee is required for procedure to be scheduled  Payment plans are available upon request  Need to apply for Financial Aid to reduce your cost, ask [place] how!   Patient will need to call [facility] (Anesthesia) at [phone] for estimate   Ligadura de trompas para pacientes ambulatorios Costo total aproximado que le toca a la paciente (NO incluye la anestesia) Costo del procedimiento ms costo del centro Salpingectoma: $855 + $5835.90 = $8756.43 [place]: $497.80 + $5835.90 = $6333.70 [place] (clips) [place]: $497.80 + $5835.90 = $6333.70 La paciente debe informarle al personal de su deseo de hacerse la ligadura de trompas. Se requiere el pago por adelantado del costo del procedimiento para que este se Sports coach. Los  planes de pago estn disponibles por solicitud. La paciente debe llamar a [facility] Janann August) al [phone] para pedir la cotizacin.

## 2023-02-08 ENCOUNTER — Ambulatory Visit (INDEPENDENT_AMBULATORY_CARE_PROVIDER_SITE_OTHER): Payer: Self-pay | Admitting: Obstetrics and Gynecology

## 2023-02-08 ENCOUNTER — Other Ambulatory Visit (HOSPITAL_COMMUNITY)
Admission: RE | Admit: 2023-02-08 | Discharge: 2023-02-08 | Disposition: A | Discharge status: Home / Self Care | Payer: Self-pay | Source: Ambulatory Visit | Attending: Obstetrics and Gynecology | Admitting: Obstetrics and Gynecology

## 2023-02-08 VITALS — BP 105/70 | HR 66 | Wt 186.0 lb

## 2023-02-08 DIAGNOSIS — Z302 Encounter for sterilization: Secondary | ICD-10-CM

## 2023-02-08 DIAGNOSIS — Z3480 Encounter for supervision of other normal pregnancy, unspecified trimester: Secondary | ICD-10-CM

## 2023-02-08 DIAGNOSIS — O99283 Endocrine, nutritional and metabolic diseases complicating pregnancy, third trimester: Secondary | ICD-10-CM

## 2023-02-08 DIAGNOSIS — O24419 Gestational diabetes mellitus in pregnancy, unspecified control: Secondary | ICD-10-CM

## 2023-02-08 DIAGNOSIS — Z603 Acculturation difficulty: Secondary | ICD-10-CM

## 2023-02-08 DIAGNOSIS — O09523 Supervision of elderly multigravida, third trimester: Secondary | ICD-10-CM

## 2023-02-08 DIAGNOSIS — R87610 Atypical squamous cells of undetermined significance on cytologic smear of cervix (ASC-US): Secondary | ICD-10-CM

## 2023-02-08 DIAGNOSIS — Z8279 Family history of other congenital malformations, deformations and chromosomal abnormalities: Secondary | ICD-10-CM

## 2023-02-08 DIAGNOSIS — E039 Hypothyroidism, unspecified: Secondary | ICD-10-CM

## 2023-02-08 DIAGNOSIS — Z3A36 36 weeks gestation of pregnancy: Secondary | ICD-10-CM

## 2023-02-08 DIAGNOSIS — Z758 Other problems related to medical facilities and other health care: Secondary | ICD-10-CM

## 2023-02-08 NOTE — Progress Notes (Signed)
   PRENATAL VISIT NOTE  Subjective:  Debra Eaton is a 37 y.o. D6L8756 at [redacted]w[redacted]d being seen today for ongoing prenatal care.  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  Patient reports no complaints.  Contractions: Irregular. Vag. Bleeding: None.  Movement: Present. Denies leaking of fluid.   The following portions of the patient's history were reviewed and updated as appropriate: allergies, current medications, past family history, past medical history, past social history, past surgical history and problem list.   Objective:   Vitals:   02/08/23 0958  BP: 105/70  Pulse: 66  Weight: 186 lb (84.4 kg)    Fetal Status: Fetal Heart Rate (bpm): 133 Fundal Height: 38 cm Movement: Present     General:  Alert, oriented and cooperative. Patient is in no acute distress.  Skin: Skin is warm and dry. No rash noted.   Cardiovascular: Normal heart rate noted  Respiratory: Normal respiratory effort, no problems with respiration noted  Abdomen: Soft, gravid, appropriate for gestational age.  Pain/Pressure: Present      Assessment and Plan:  Pregnancy: G7P5015 at [redacted]w[redacted]d 1. Supervision of other normal pregnancy, antepartum 2. [redacted] weeks gestation of pregnancy - Culture, beta strep (group b only) - Cervicovaginal ancillary only( [place])  3. Gestational diabetes mellitus (GDM) in third trimester, gestational diabetes method of control unspecified Does not have BG available for review today. She verbally reports fasting 80-88 and postprandial 105-115.  Encouraged pt to start tracking on her phone  so that she can have log readily available Our office has attempted to get growth [place] report multiple times but have not been able to obtain it for this appointment Will need to f/u growth next week for delivery planning Pt aware of plan for IOL between 37-39 weeks pending BG and growth results  4. Hypothyroidism affecting pregnancy in third trimester Taking synthroid daily - TSH  5. AMA (advanced maternal age) multigravida 35+, third trimester ldASA LR NIPS, normal anatomy  6. Request for sterilization - She desires permanent sterilization. Discussed alternatives including LARC options and vasectomy. She declines these options.  - Discussed surgery of salpingectomy for better contraceptive efficacy and potential to decrease risk of ovarian cancer - Risks of surgery include but are not limited to: bleeding, infection, injury to surrounding organs/tissues (i.e. bowel/bladder/ureters), need for additional procedures, wound complications, hospital re-admission, and conversion to open surgery, VTE. We reviewed risk of contraceptive failure and risk of regret.  - Reviewed restrictions and recovery following surgery Pt confirms her desire for permanent sterilization  7. Language barrier [name] and counseled with in person Spanish interpreter  8. Family history of spina bifida Normal anatomy  Please refer to After Visit Summary for other counseling recommendations.   Return in about 1 week (around 02/15/2023) for return OB at 37 weeks.  Future Appointments  Date Time Provider Department Center  02/15/2023  8:55 AM Adam Phenix, MD [facility] None  03/07/2023  2:00 PM Julieanne Manson, MD [facility] None    Lennart Pall, MD

## 2023-02-08 NOTE — Progress Notes (Signed)
Pt states glucose readings have been normal, does not have with her today. Pt had u/s 9/20 - report not yet in chart.

## 2023-02-09 LAB — CERVICOVAGINAL ANCILLARY ONLY
Chlamydia: NEGATIVE
Comment: NEGATIVE
Comment: NORMAL
Neisseria Gonorrhea: NEGATIVE

## 2023-02-09 LAB — TSH: TSH: 4.17 u[IU]/mL (ref 0.450–4.500)

## 2023-02-09 MED ORDER — LEVOTHYROXINE SODIUM 100 MCG PO TABS: 100.0000 ug | ORAL_TABLET | Freq: Every day | ORAL | 11 refills | Status: AC

## 2023-02-09 NOTE — Addendum Note (Signed)
Addended by: Harvie Bridge on: 02/09/2023 02:08 PM   Modules accepted: Orders

## 2023-02-12 LAB — CULTURE, BETA STREP (GROUP B ONLY): Strep Gp B Culture: NEGATIVE

## 2023-02-15 ENCOUNTER — Ambulatory Visit (INDEPENDENT_AMBULATORY_CARE_PROVIDER_SITE_OTHER): Payer: Self-pay | Admitting: Obstetrics & Gynecology

## 2023-02-15 VITALS — BP 115/75 | HR 66 | Wt 187.0 lb

## 2023-02-15 DIAGNOSIS — Z3480 Encounter for supervision of other normal pregnancy, unspecified trimester: Secondary | ICD-10-CM

## 2023-02-15 DIAGNOSIS — O09523 Supervision of elderly multigravida, third trimester: Secondary | ICD-10-CM

## 2023-02-15 DIAGNOSIS — Z302 Encounter for sterilization: Secondary | ICD-10-CM

## 2023-02-15 DIAGNOSIS — O24419 Gestational diabetes mellitus in pregnancy, unspecified control: Secondary | ICD-10-CM

## 2023-02-15 NOTE — Progress Notes (Unsigned)
Pt states last u/s in Sept- do not have results. Pt states glucose readings have been normal.

## 2023-02-15 NOTE — Progress Notes (Unsigned)
   PRENATAL VISIT NOTE  Subjective:  Debra Eaton is a 37 y.o. Z6X0960 at [redacted]w[redacted]d being seen today for ongoing prenatal care.  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  Patient reports no complaints.  Contractions: Not present. Vag. Bleeding: None.  Movement: Present. Denies leaking of fluid.   The following portions of the patient's history were reviewed and updated as appropriate: allergies, current medications, past family history, past medical history, past social history, past surgical history and problem list.   Objective:   Vitals:   02/15/23 0912  BP: 115/75  Pulse: 66  Weight: 187 lb (84.8 kg)    Fetal Status: Fetal Heart Rate (bpm): 140   Movement: Present     General:  Alert, oriented and cooperative. Patient is in no acute distress.  Skin: Skin is warm and dry. No rash noted.   Cardiovascular: Normal heart rate noted  Respiratory: Normal respiratory effort, no problems with respiration noted  Abdomen: Soft, gravid, appropriate for gestational age.  Pain/Pressure: Present     Pelvic: Cervical exam deferred        Extremities: Normal range of motion.     Mental Status: Normal mood and affect. Normal behavior. Normal judgment and thought content.   Assessment and Plan:  Pregnancy: A5W0981 at [redacted]w[redacted]d 1. Gestational diabetes mellitus (GDM) in third trimester, gestational diabetes method of control unspecified Good diet control  2. AMA (advanced maternal age) multigravida 35+, third trimester   3. Supervision of other normal pregnancy, antepartum Need [place] report from  [facility]  4. Request for sterilization BTL planned  Preterm labor symptoms and general obstetric precautions including but not limited to vaginal bleeding, contractions, leaking of fluid and fetal movement were reviewed in detail with the patient. Please refer to After Visit Summary for other counseling recommendations.   Return in about 1 week (around 02/22/2023).  Future Appointments  Date Time Provider Department Center  03/07/2023  2:00 PM Julieanne Manson, MD [facility] None    Scheryl Darter, MD

## 2023-02-18 ENCOUNTER — Inpatient Hospital Stay (HOSPITAL_COMMUNITY)
Admission: AD | Admit: 2023-02-18 | Discharge: 2023-02-20 | DRG: 807 | Disposition: A | Discharge status: Home / Self Care | Payer: Medicaid Other | Attending: Family Medicine | Admitting: Family Medicine

## 2023-02-18 ENCOUNTER — Encounter (HOSPITAL_COMMUNITY): Payer: Self-pay | Admitting: Family Medicine

## 2023-02-18 ENCOUNTER — Other Ambulatory Visit: Payer: Self-pay

## 2023-02-18 DIAGNOSIS — O99284 Endocrine, nutritional and metabolic diseases complicating childbirth: Secondary | ICD-10-CM | POA: Diagnosis present

## 2023-02-18 DIAGNOSIS — Z8279 Family history of other congenital malformations, deformations and chromosomal abnormalities: Secondary | ICD-10-CM | POA: Diagnosis not present

## 2023-02-18 DIAGNOSIS — E039 Hypothyroidism, unspecified: Secondary | ICD-10-CM | POA: Diagnosis present

## 2023-02-18 DIAGNOSIS — Z3A37 37 weeks gestation of pregnancy: Secondary | ICD-10-CM

## 2023-02-18 DIAGNOSIS — Z833 Family history of diabetes mellitus: Secondary | ICD-10-CM

## 2023-02-18 DIAGNOSIS — Z8249 Family history of ischemic heart disease and other diseases of the circulatory system: Secondary | ICD-10-CM | POA: Diagnosis not present

## 2023-02-18 DIAGNOSIS — O9962 Diseases of the digestive system complicating childbirth: Secondary | ICD-10-CM | POA: Diagnosis present

## 2023-02-18 DIAGNOSIS — O24424 Gestational diabetes mellitus in childbirth, insulin controlled: Secondary | ICD-10-CM | POA: Diagnosis not present

## 2023-02-18 DIAGNOSIS — O9902 Anemia complicating childbirth: Secondary | ICD-10-CM | POA: Diagnosis present

## 2023-02-18 DIAGNOSIS — O99214 Obesity complicating childbirth: Secondary | ICD-10-CM | POA: Diagnosis present

## 2023-02-18 DIAGNOSIS — K219 Gastro-esophageal reflux disease without esophagitis: Secondary | ICD-10-CM | POA: Diagnosis present

## 2023-02-18 DIAGNOSIS — O09523 Supervision of elderly multigravida, third trimester: Secondary | ICD-10-CM | POA: Diagnosis not present

## 2023-02-18 DIAGNOSIS — O24420 Gestational diabetes mellitus in childbirth, diet controlled: Principal | ICD-10-CM | POA: Diagnosis present

## 2023-02-18 DIAGNOSIS — O26893 Other specified pregnancy related conditions, third trimester: Secondary | ICD-10-CM | POA: Diagnosis present

## 2023-02-18 DIAGNOSIS — Z841 Family history of disorders of kidney and ureter: Secondary | ICD-10-CM | POA: Diagnosis not present

## 2023-02-18 HISTORY — DX: Gestational diabetes mellitus in pregnancy, unspecified control: O24.419

## 2023-02-18 LAB — CBC
HCT: 33.1 % — ABNORMAL LOW (ref 36.0–46.0)
Hemoglobin: 10.7 g/dL — ABNORMAL LOW (ref 12.0–15.0)
MCH: 25.5 pg — ABNORMAL LOW (ref 26.0–34.0)
MCHC: 32.3 g/dL (ref 30.0–36.0)
MCV: 79 fL — ABNORMAL LOW (ref 80.0–100.0)
Platelets: 210 10*3/uL (ref 150–400)
RBC: 4.19 MIL/uL (ref 3.87–5.11)
RDW: 16 % — ABNORMAL HIGH (ref 11.5–15.5)
WBC: 6.6 10*3/uL (ref 4.0–10.5)
nRBC: 0 % (ref 0.0–0.2)

## 2023-02-18 LAB — TYPE AND SCREEN
ABO/RH(D): O POS
Antibody Screen: NEGATIVE

## 2023-02-18 MED ORDER — OXYTOCIN-SODIUM CHLORIDE 30-0.9 UT/500ML-% IV SOLN
2.5000 [IU]/h | INTRAVENOUS | Status: DC
  Administered 2023-02-19: 2.5 [IU]/h via INTRAVENOUS

## 2023-02-18 MED ORDER — SOD CITRATE-CITRIC ACID 500-334 MG/5ML PO SOLN: 30.0000 mL | ORAL | Status: DC | PRN

## 2023-02-18 MED ORDER — OXYCODONE-ACETAMINOPHEN 5-325 MG PO TABS: 1.0000 | ORAL_TABLET | ORAL | Status: DC | PRN

## 2023-02-18 MED ORDER — LACTATED RINGERS IV SOLN
500.0000 mL | Freq: Once | INTRAVENOUS | Status: AC
  Administered 2023-02-18: 500 mL via INTRAVENOUS

## 2023-02-18 MED ORDER — PHENYLEPHRINE 80 MCG/ML (10ML) SYRINGE FOR IV PUSH (FOR BLOOD PRESSURE SUPPORT): 80.0000 ug | PREFILLED_SYRINGE | INTRAVENOUS | Status: DC | PRN

## 2023-02-18 MED ORDER — OXYTOCIN-SODIUM CHLORIDE 30-0.9 UT/500ML-% IV SOLN
1.0000 m[IU]/min | INTRAVENOUS | Status: DC
  Administered 2023-02-18: 2 m[IU]/min via INTRAVENOUS
  Filled 2023-02-18: qty 500

## 2023-02-18 MED ORDER — LACTATED RINGERS IV SOLN: INTRAVENOUS | Status: DC

## 2023-02-18 MED ORDER — FENTANYL CITRATE (PF) 100 MCG/2ML IJ SOLN: 50.0000 ug | INTRAMUSCULAR | Status: DC | PRN

## 2023-02-18 MED ORDER — LIDOCAINE HCL (PF) 1 % IJ SOLN: 30.0000 mL | INTRAMUSCULAR | Status: DC | PRN

## 2023-02-18 MED ORDER — LACTATED RINGERS IV SOLN: 500.0000 mL | INTRAVENOUS | Status: DC | PRN

## 2023-02-18 MED ORDER — EPHEDRINE 5 MG/ML INJ: 10.0000 mg | INTRAVENOUS | Status: DC | PRN

## 2023-02-18 MED ORDER — FENTANYL-BUPIVACAINE-NACL 0.5-0.125-0.9 MG/250ML-% EP SOLN
12.0000 mL/h | EPIDURAL | Status: DC | PRN
  Administered 2023-02-19: 12 mL/h via EPIDURAL
  Filled 2023-02-18: qty 250

## 2023-02-18 MED ORDER — OXYTOCIN BOLUS FROM INFUSION
333.0000 mL | Freq: Once | INTRAVENOUS | Status: AC
  Administered 2023-02-19: 333 mL via INTRAVENOUS

## 2023-02-18 MED ORDER — OXYCODONE-ACETAMINOPHEN 5-325 MG PO TABS: 2.0000 | ORAL_TABLET | ORAL | Status: DC | PRN

## 2023-02-18 MED ORDER — DIPHENHYDRAMINE HCL 50 MG/ML IJ SOLN: 12.5000 mg | INTRAMUSCULAR | Status: DC | PRN

## 2023-02-18 MED ORDER — TERBUTALINE SULFATE 1 MG/ML IJ SOLN: 0.2500 mg | Freq: Once | INTRAMUSCULAR | Status: DC | PRN

## 2023-02-18 MED ORDER — ACETAMINOPHEN 325 MG PO TABS: 650.0000 mg | ORAL_TABLET | ORAL | Status: DC | PRN

## 2023-02-18 MED ORDER — ONDANSETRON HCL 4 MG/2ML IJ SOLN: 4.0000 mg | Freq: Four times a day (QID) | INTRAMUSCULAR | Status: DC | PRN

## 2023-02-18 NOTE — MAU Note (Signed)
Discussed with pt per dr Judd Lien and pt is receptive to AROM and augmentation. Pt's response reported to MD .

## 2023-02-18 NOTE — Anesthesia Preprocedure Evaluation (Addendum)
Anesthesia Evaluation  Patient identified by MRN, date of birth, ID band Patient awake    Reviewed: Allergy & Precautions, Patient's Chart, lab work & pertinent test results  Airway Mallampati: II       Dental no notable dental hx.    Pulmonary neg pulmonary ROS   Pulmonary exam normal        Cardiovascular negative cardio ROS Normal cardiovascular exam Rhythm:Regular     Neuro/Psych negative neurological ROS  negative psych ROS   GI/Hepatic Neg liver ROS,GERD  ,,  Endo/Other  diabetes, Well Controlled, GestationalHypothyroidism  Obesity  Renal/GU negative Renal ROS  negative genitourinary   Musculoskeletal negative musculoskeletal ROS (+)    Abdominal  (+) + obese  Peds  Hematology  (+) Blood dyscrasia, anemia   Anesthesia Other Findings   Reproductive/Obstetrics (+) Pregnancy                              Anesthesia Physical Anesthesia Plan  ASA: 2  Anesthesia Plan: Epidural   Post-op Pain Management:    Induction:   PONV Risk Score and Plan:   Airway Management Planned: Natural Airway  Additional Equipment: Fetal Monitoring and None  Intra-op Plan:   Post-operative Plan:   Informed Consent: I have reviewed the patients History and Physical, chart, labs and discussed the procedure including the risks, benefits and alternatives for the proposed anesthesia with the patient or authorized representative who has indicated his/her understanding and acceptance.       Plan Discussed with: Anesthesiologist  Anesthesia Plan Comments:          Anesthesia Quick Evaluation

## 2023-02-18 NOTE — Progress Notes (Signed)
Labor Progress Note  Sagal Gayton is a 37 y.o. L2G4010 at [redacted]w[redacted]d presented for SOL  S: patient comfortable, feeling ctx q5-87mins   O:  BP 138/83 (BP Location: Left Arm)   Pulse 75   Temp 98.3 F (36.8 C) (Oral)   Resp 16   Ht 5\' 4"  (1.626 m)   Wt 81.6 kg   LMP 05/31/2022   SpO2 99%   BMI 30.90 kg/m  EFM:135bpm/Moderate variability/ 15x15 accels/ None decels CAT: 1 Toco: regular, every 5-7 minutes   CVE: Dilation: 6 Effacement (%): 60 Cervical Position: Posterior Station: -1 Presentation: Vertex Exam by:: Dr Teressa Lower   A&P: 37 y.o. U7O5366 [redacted]w[redacted]d  here for SOL as above  #Labor: Progressing well. AROM, thin mec.  #Pain: Epidural planned #FWB: CAT 1 #GBS negative  #AMA #GDMA1: @36 .2, 3011g 68%, feels like this baby is bigger, was told >9lbs and proven to 8lb9oz -Q4hr CBGs -shoulder precautions at time of delivery   #Hypothyroidism:  -continue home Levothyroxine QAM  #Child with Spina bifida: nrml anatomy US   Hessie Dibble, MD FMOB Fellow, Faculty practice [facility], Center for [facility] 02/18/23  11:57 PM

## 2023-02-18 NOTE — Progress Notes (Signed)
Vertex position confirmed via U/S by Dr Shawnie Pons

## 2023-02-18 NOTE — H&P (Addendum)
OBSTETRIC ADMISSION HISTORY AND PHYSICAL  Debra Eaton is a 37 y.o. female [phone] with IUP at [redacted]w[redacted]d by LMP presenting for SOL. She reports +FMs, No LOF, no VB, no blurry vision, headaches or peripheral edema, and RUQ pain.  She plans on breast feeding. She requests BTL done PP or vasectomy for birth control. She received her prenatal care at  [facility] .  Dating: By LMP (c/w 24w [place]) --->  Estimated Date of Delivery: 03/07/23 Sono:  @[redacted]w[redacted]d , normal anatomy, vertex presentation, anterior placenta, 3011g, 68% EFW  Prenatal History/Complications: A1GDM, hypothyroidism on 100 mcg Levothyroxine daily 1 child with spina bifida (AFP normal, normal anatomy scan)  Past Medical History: Past Medical History:  Diagnosis Date   Gestational diabetes    Hypothyroidism    Past Surgical History: Past Surgical History:  Procedure Laterality Date   NO PAST SURGERIES     Obstetrical History: OB History     Gravida  7   Para  5   Term  5   Preterm  0   AB  1   Living  5      SAB  1   IAB  0   Ectopic  0   Multiple  0   Live Births  5           Social History Social History   Socioeconomic History   Marital status: Single    Spouse name: Not on file   Number of children: Not on file   Years of education: Not on file   Highest education level: Not on file  Occupational History   Not on file  Tobacco Use   Smoking status: Never   Smokeless tobacco: Never  Vaping Use   Vaping status: Never Used  Substance and Sexual Activity   Alcohol use: Not Currently    Comment: occasional   Drug use: No   Sexual activity: Yes    Birth control/protection: None  Other Topics Concern   Not on file  Social History Narrative   Not on file   Social Determinants of Health   Financial Resource Strain: Not on file  Food Insecurity: Not on file  Transportation Needs: Not on file  Physical Activity: Not on file  Stress: Not on file  Social Connections:  Not on file    Family History: Family History  Problem Relation Age of Onset   Diabetes Mother    Heart disease Father    Kidney disease Father    Liver disease Father     Allergies: No Known Allergies  Medications Prior to Admission  Medication Sig Dispense Refill Last Dose   aspirin EC 81 MG tablet Take 1 tablet (81 mg total) by mouth daily. Start taking when you are [redacted] weeks pregnant for rest of pregnancy for prevention of preeclampsia 300 tablet 2 Past Week   levothyroxine (SYNTHROID) 100 MCG tablet Take 1 tablet (100 mcg total) by mouth daily before breakfast. 30 tablet 11 02/18/2023   Accu-Chek Softclix Lancets lancets Use to take blood sugars four times a day as instructed 100 each 12    acetaminophen (TYLENOL) 325 MG tablet Take 2 tablets (650 mg total) by mouth every 4 (four) hours as needed (for pain scale < 4). (Patient not taking: Reported on 06/09/2021) 60 tablet 0    Blood Glucose Monitoring Suppl (ACCU-CHEK GUIDE) w/Device KIT 1 Device by Does not apply route 4 (four) times daily. 1 kit 0    glucose blood (ACCU-CHEK  GUIDE) test strip Use to check blood sugars four times a day was instructed 50 each 12    Prenatal Vit-Fe Fumarate-FA (MULTIVITAMIN-PRENATAL) 27-0.8 MG TABS tablet Take 1 tablet by mouth daily at 12 noon.   02/16/2023    Review of Systems  All systems reviewed and negative except as stated in HPI  Blood pressure 128/70, pulse 76, temperature 99.1 F (37.3 C), temperature source Oral, resp. rate 17, height 5\' 4"  (1.626 m), weight 81.6 kg, last menstrual period 05/31/2022, SpO2 99%, currently breastfeeding.  General appearance: no distress Lungs: clear to auscultation bilaterally Heart: regular rate and rhythm Abdomen: soft, non-tender; bowel sounds normal Pelvic: No lesions, erythema. Extremities: Homans sign is negative, no sign of DVT DTR's 2+  Presentation: cephalic Fetal monitoring: Baseline 120/Moderate variability/15x15 accels/no decels Uterine  activity: Every 5-6 minutes  Dilation: 4 Effacement (%): 50 Cervical Position: Posterior Exam by:: weston,rn  Prenatal labs: ABO, Rh: O/Positive/-- (06/25 1557) Antibody: Negative (06/25 1557) Rubella: 24.40 (06/25 1557) RPR: Non Reactive (07/22 0926)  HBsAg: Negative (06/25 1557)  HIV: Non Reactive (07/22 0926)  GBS: Negative/-- (10/01 1056)  GTT: A1c 5.6 early, third trimester GDM Genetic screening: NIPS: LR F, AFP: neg, Horizon negative x4 in 2022 Anatomy US: Normal  NURSING  PROVIDER  Office Location [facility] Dating by  LMP c/w 24w [place]  PNC Model Traditional Anatomy U/S EFW >97%ile, rpt planned?([facility])  Initiated care at  [facility] and Spanish               LAB RESULTS   Support Person  [facility] NIPS: LR F AFP: neg      NT/IT (FT only)        Carrier Screen Horizon:  neg x 4 in 2022  Rhogam  O/Positive/-- (06/25 1557) A1C/GTT Early: A1C 5.6 Third trimester: GDM  Flu Vaccine        TDaP Vaccine  11/29/22 Blood Type O/Positive/-- (06/25 1557)  Covid Vaccine   Antibody Negative (06/25 1557)      Rubella 24.40 (06/25 1557)  Feeding Plan breast RPR Non Reactive (07/22 4010)  Contraception bilateral tubal ligation or vasectomy HBsAg Negative (06/25 1557)  Circumcision  No HIV Non Reactive (07/22 0926)  Pediatrician   [facility] and A & P HCVAb Non Reactive (06/25 1557)  Prenatal Classes            Pap       Diagnosis  Date Value Ref Range Status  11/14/2020 (A)   Final    - Atypical squamous cells of undetermined significance (ASC-US)    BTLConsent  Signed 01/11/23 GC/CT Initial:  neg 36wks:  neg  VBAC  Consent   GBS For PCN allergy, check sensitivities            DME Rx [ x] BP cuff [ ]  Weight Scale Waterbirth  [ ]  Class [ ]  Consent [ ]  CNM visit  PHQ9 & GAD7 [  x] new OB Arly.Keller  ] 28 weeks  [  ] 36 weeks Induction  [ ]  Orders Entered [ ] Foley Y/N    Prenatal Transfer Tool  Maternal Diabetes: Yes:  Diabetes Type:  Insulin/Medication  controlled (A1GDM) Genetic Screening: Normal Maternal Ultrasounds/Referrals: Normal Fetal Ultrasounds or other Referrals:  Other: [facility] Maternal Substance Abuse:  No Significant Maternal Medications:  Meds include: Syntroid Other: aspirin Significant Maternal Lab Results: Group B Strep negative  No  results found for this or any previous visit (from the past 24 hour(s)).  Patient Active Problem List   Diagnosis Date Noted   Request for sterilization 01/11/2023   Language barrier 01/11/2023   GDM (gestational diabetes mellitus) 11/30/2022   AMA (advanced maternal age) multigravida 35+, third trimester 11/29/2022   Hypothyroidism affecting pregnancy 11/03/2022   Supervision of other normal pregnancy, antepartum 11/02/2022   Family history of spina bifida 04/27/2021   Pap smear abnormality of cervix with ASCUS favoring benign 02/05/2021    Assessment/Plan:  Agam Tuohy is a 37 y.o. G7P5015 at [redacted]w[redacted]d here for SOL.  #Labor: Making change, now s/p AROM.  On pitocin 2/min for augmentation. #Pain: Planning for epidural. #Fetal Well Being: Cat I #ID: GBS negative #Method Of Feeding: breast feeding #Method Of Contraception: bilateral tubal ligation (consent signed 9/3) #Circ: N/A  Dimitry Shitarev, MD  Evaluation and management procedures were performed by the Family Medicine Resident under my supervision. I was immediately available for direct supervision, assistance and direction throughout this encounter.  I also confirm that I have verified the information documented in the resident's note, and that I have also personally reperformed the pertinent components of the physical exam and all of the medical decision making activities.  I have also made any necessary editorial changes.   -shoulder precautions given GDM and potential for larger baby than proven 8lb9oz -CBGs for glucose monitoring -continue home synthroid QAM   Mittie Bodo, MD Family Medicine -  Obstetrics Fellow

## 2023-02-18 NOTE — Plan of Care (Signed)

## 2023-02-19 ENCOUNTER — Inpatient Hospital Stay (HOSPITAL_COMMUNITY): Payer: Medicaid Other | Admitting: Anesthesiology

## 2023-02-19 ENCOUNTER — Encounter (HOSPITAL_COMMUNITY): Payer: Self-pay | Admitting: Family Medicine

## 2023-02-19 DIAGNOSIS — O09523 Supervision of elderly multigravida, third trimester: Secondary | ICD-10-CM

## 2023-02-19 DIAGNOSIS — O99284 Endocrine, nutritional and metabolic diseases complicating childbirth: Secondary | ICD-10-CM

## 2023-02-19 DIAGNOSIS — O24424 Gestational diabetes mellitus in childbirth, insulin controlled: Secondary | ICD-10-CM

## 2023-02-19 DIAGNOSIS — Z3A37 37 weeks gestation of pregnancy: Secondary | ICD-10-CM

## 2023-02-19 LAB — RPR: RPR Ser Ql: NONREACTIVE

## 2023-02-19 LAB — CBC
HCT: 33.2 % — ABNORMAL LOW (ref 36.0–46.0)
Hemoglobin: 10.6 g/dL — ABNORMAL LOW (ref 12.0–15.0)
MCH: 25.5 pg — ABNORMAL LOW (ref 26.0–34.0)
MCHC: 31.9 g/dL (ref 30.0–36.0)
MCV: 79.8 fL — ABNORMAL LOW (ref 80.0–100.0)
Platelets: 192 10*3/uL (ref 150–400)
RBC: 4.16 MIL/uL (ref 3.87–5.11)
RDW: 15.9 % — ABNORMAL HIGH (ref 11.5–15.5)
WBC: 9.7 10*3/uL (ref 4.0–10.5)
nRBC: 0 % (ref 0.0–0.2)

## 2023-02-19 MED ORDER — DIBUCAINE (PERIANAL) 1 % EX OINT: 1.0000 | TOPICAL_OINTMENT | CUTANEOUS | Status: DC | PRN

## 2023-02-19 MED ORDER — ONDANSETRON HCL 4 MG PO TABS: 4.0000 mg | ORAL_TABLET | ORAL | Status: DC | PRN

## 2023-02-19 MED ORDER — SODIUM CHLORIDE 0.9% FLUSH: 3.0000 mL | Freq: Two times a day (BID) | INTRAVENOUS | Status: DC

## 2023-02-19 MED ORDER — SIMETHICONE 80 MG PO CHEW: 80.0000 mg | CHEWABLE_TABLET | ORAL | Status: DC | PRN

## 2023-02-19 MED ORDER — SODIUM CHLORIDE 0.9% FLUSH: 3.0000 mL | INTRAVENOUS | Status: DC | PRN

## 2023-02-19 MED ORDER — IBUPROFEN 600 MG PO TABS
600.0000 mg | ORAL_TABLET | Freq: Four times a day (QID) | ORAL | Status: DC
  Administered 2023-02-19 – 2023-02-20 (×5): 600 mg via ORAL
  Filled 2023-02-19 (×5): qty 1

## 2023-02-19 MED ORDER — BENZOCAINE-MENTHOL 20-0.5 % EX AERO: 1.0000 | INHALATION_SPRAY | CUTANEOUS | Status: DC | PRN

## 2023-02-19 MED ORDER — ACETAMINOPHEN 325 MG PO TABS: 650.0000 mg | ORAL_TABLET | ORAL | Status: DC | PRN

## 2023-02-19 MED ORDER — PRENATAL MULTIVITAMIN CH
1.0000 | ORAL_TABLET | Freq: Every day | ORAL | Status: DC
  Administered 2023-02-19: 1 via ORAL
  Filled 2023-02-19: qty 1

## 2023-02-19 MED ORDER — DIPHENHYDRAMINE HCL 25 MG PO CAPS: 25.0000 mg | ORAL_CAPSULE | Freq: Four times a day (QID) | ORAL | Status: DC | PRN

## 2023-02-19 MED ORDER — SENNOSIDES-DOCUSATE SODIUM 8.6-50 MG PO TABS
2.0000 | ORAL_TABLET | ORAL | Status: DC
  Administered 2023-02-19: 2 via ORAL
  Filled 2023-02-19: qty 2

## 2023-02-19 MED ORDER — WITCH HAZEL-GLYCERIN EX PADS: 1.0000 | MEDICATED_PAD | CUTANEOUS | Status: DC | PRN

## 2023-02-19 MED ORDER — ONDANSETRON HCL 4 MG/2ML IJ SOLN: 4.0000 mg | INTRAMUSCULAR | Status: DC | PRN

## 2023-02-19 MED ORDER — LEVOTHYROXINE SODIUM 100 MCG PO TABS
100.0000 ug | ORAL_TABLET | Freq: Every morning | ORAL | Status: DC
  Administered 2023-02-19 – 2023-02-20 (×2): 100 ug via ORAL
  Filled 2023-02-19 (×3): qty 1

## 2023-02-19 MED ORDER — SODIUM CHLORIDE 0.9 % IV SOLN: 250.0000 mL | INTRAVENOUS | Status: DC | PRN

## 2023-02-19 MED ORDER — ZOLPIDEM TARTRATE 5 MG PO TABS: 5.0000 mg | ORAL_TABLET | Freq: Every evening | ORAL | Status: DC | PRN

## 2023-02-19 MED ORDER — LIDOCAINE HCL (PF) 1 % IJ SOLN
INTRAMUSCULAR | Status: DC | PRN
  Administered 2023-02-19 (×2): 5 mL via EPIDURAL

## 2023-02-19 MED ORDER — COCONUT OIL OIL: 1.0000 | TOPICAL_OIL | Status: DC | PRN

## 2023-02-19 NOTE — Anesthesia Procedure Notes (Signed)
Epidural Patient location during procedure: OB Start time: 02/19/2023 12:30 AM End time: 02/19/2023 12:38 AM  Staffing Anesthesiologist: Mal Amabile, MD Performed: anesthesiologist   Preanesthetic Checklist Completed: patient identified, IV checked, site marked, risks and benefits discussed, surgical consent, monitors and equipment checked, pre-op evaluation and timeout performed  Epidural Patient position: sitting Prep: DuraPrep and site prepped and draped Patient monitoring: continuous pulse ox and blood pressure Approach: midline Location: L3-L4 Injection technique: LOR air  Needle:  Needle type: Tuohy  Needle gauge: 17 G Needle length: 9 cm and 9 Needle insertion depth: 4 cm Catheter type: closed end flexible Catheter size: 19 Gauge Catheter at skin depth: 9 cm Test dose: negative and Other  Assessment Events: blood not aspirated, no cerebrospinal fluid, injection not painful, no injection resistance, no paresthesia and negative IV test  Additional Notes Patient identified. Risks and benefits discussed including failed block, incomplete  Pain control, post dural puncture headache, nerve damage, paralysis, blood pressure Changes, nausea, vomiting, reactions to medications-both toxic and allergic and post Partum back pain. All questions were answered. Patient expressed understanding and wished to proceed. Sterile technique was used throughout procedure. Epidural site was Dressed with sterile barrier dressing. No paresthesias, signs of intravascular injection Or signs of intrathecal spread were encountered.  Patient was more comfortable after the epidural was dosed. Please see RN's note for documentation of vital signs and FHR which are stable. Reason for block:procedure for pain

## 2023-02-19 NOTE — Progress Notes (Signed)
POSTPARTUM PROGRESS NOTE  PPD#0  Debra Eaton is a 37 y.o. [id number] s/p SVD at [redacted]w[redacted]d.  BTL planning: Reported that she had a snack and drinks at ~0700, but later told RN that her last meal was at 0500.  Debra Eaton FM PGY-1

## 2023-02-19 NOTE — Plan of Care (Signed)
Problem: Education: Goal: Knowledge of Childbirth will improve 02/19/2023 0435 by Tamala Fothergill, RN Outcome: Completed/Met 02/18/2023 2237 by Tamala Fothergill, RN Outcome: Progressing Goal: Ability to make informed decisions regarding treatment and plan of care will improve 02/19/2023 0435 by Tamala Fothergill, RN Outcome: Completed/Met 02/18/2023 2237 by Tamala Fothergill, RN Outcome: Progressing Goal: Ability to state and carry out methods to decrease the pain will improve 02/19/2023 0435 by Tamala Fothergill, RN Outcome: Completed/Met 02/18/2023 2237 by Tamala Fothergill, RN Outcome: Progressing Goal: Individualized Educational Video(s) 02/19/2023 0435 by Tamala Fothergill, RN Outcome: Completed/Met 02/18/2023 2237 by Tamala Fothergill, RN Outcome: Progressing   Problem: Coping: Goal: Ability to verbalize concerns and feelings about labor and delivery will improve 02/19/2023 0435 by Tamala Fothergill, RN Outcome: Completed/Met 02/18/2023 2237 by Tamala Fothergill, RN Outcome: Progressing   Problem: Life Cycle: Goal: Ability to make normal progression through stages of labor will improve 02/19/2023 0435 by Tamala Fothergill, RN Outcome: Completed/Met 02/18/2023 2237 by Tamala Fothergill, RN Outcome: Progressing Goal: Ability to effectively push during vaginal delivery will improve 02/19/2023 0435 by Tamala Fothergill, RN Outcome: Completed/Met 02/18/2023 2237 by Tamala Fothergill, RN Outcome: Progressing   Problem: Role Relationship: Goal: Will demonstrate positive interactions with the child 02/19/2023 0435 by Tamala Fothergill, RN Outcome: Completed/Met 02/18/2023 2237 by Tamala Fothergill, RN Outcome: Progressing   Problem: Safety: Goal: Risk of complications during labor and delivery will decrease 02/19/2023 0435 by Tamala Fothergill, RN Outcome: Completed/Met 02/18/2023 2237 by Tamala Fothergill, RN Outcome: Progressing   Problem: Pain Management: Goal: Relief or  control of pain from uterine contractions will improve 02/19/2023 0435 by Tamala Fothergill, RN Outcome: Completed/Met 02/18/2023 2237 by Tamala Fothergill, RN Outcome: Progressing   Problem: Education: Goal: Knowledge of General Education information will improve Description: Including pain rating scale, medication(s)/side effects and non-pharmacologic comfort measures 02/19/2023 0435 by Tamala Fothergill, RN Outcome: Completed/Met 02/18/2023 2237 by Tamala Fothergill, RN Outcome: Progressing   Problem: Health Behavior/Discharge Planning: Goal: Ability to manage health-related needs will improve 02/19/2023 0435 by Tamala Fothergill, RN Outcome: Completed/Met 02/18/2023 2237 by Tamala Fothergill, RN Outcome: Progressing   Problem: Clinical Measurements: Goal: Ability to maintain clinical measurements within normal limits will improve 02/19/2023 0435 by Tamala Fothergill, RN Outcome: Completed/Met 02/18/2023 2237 by Tamala Fothergill, RN Outcome: Progressing Goal: Will remain free from infection 02/19/2023 0435 by Tamala Fothergill, RN Outcome: Completed/Met 02/18/2023 2237 by Tamala Fothergill, RN Outcome: Progressing Goal: Diagnostic test results will improve 02/19/2023 0435 by Tamala Fothergill, RN Outcome: Completed/Met 02/18/2023 2237 by Tamala Fothergill, RN Outcome: Progressing Goal: Respiratory complications will improve 02/19/2023 0435 by Tamala Fothergill, RN Outcome: Completed/Met 02/18/2023 2237 by Tamala Fothergill, RN Outcome: Progressing Goal: Cardiovascular complication will be avoided 02/19/2023 0435 by Tamala Fothergill, RN Outcome: Completed/Met 02/18/2023 2237 by Tamala Fothergill, RN Outcome: Progressing   Problem: Activity: Goal: Risk for activity intolerance will decrease 02/19/2023 0435 by Tamala Fothergill, RN Outcome: Completed/Met 02/18/2023 2237 by Tamala Fothergill, RN Outcome: Progressing   Problem: Nutrition: Goal: Adequate nutrition will be  maintained 02/19/2023 0435 by Tamala Fothergill, RN Outcome: Completed/Met 02/18/2023 2237 by Tamala Fothergill, RN Outcome: Progressing   Problem: Coping: Goal: Level of anxiety will decrease 02/19/2023 0435 by Tamala Fothergill, RN Outcome: Completed/Met 02/18/2023 2237 by Tamala Fothergill, RN Outcome: Progressing   Problem: Elimination:  Goal: Will not experience complications related to bowel motility 02/19/2023 0435 by Tamala Fothergill, RN Outcome: Completed/Met 02/18/2023 2237 by Tamala Fothergill, RN Outcome: Progressing Goal: Will not experience complications related to urinary retention 02/19/2023 0435 by Tamala Fothergill, RN Outcome: Completed/Met 02/18/2023 2237 by Tamala Fothergill, RN Outcome: Progressing   Problem: Pain Managment: Goal: General experience of comfort will improve 02/19/2023 0435 by Tamala Fothergill, RN Outcome: Completed/Met 02/18/2023 2237 by Tamala Fothergill, RN Outcome: Progressing   Problem: Safety: Goal: Ability to remain free from injury will improve 02/19/2023 0435 by Tamala Fothergill, RN Outcome: Completed/Met 02/18/2023 2237 by Tamala Fothergill, RN Outcome: Progressing   Problem: Skin Integrity: Goal: Risk for impaired skin integrity will decrease 02/19/2023 0435 by Tamala Fothergill, RN Outcome: Completed/Met 02/18/2023 2237 by Tamala Fothergill, RN Outcome: Progressing   Problem: Education: Goal: Knowledge of condition will improve Outcome: Completed/Met Goal: Individualized Educational Video(s) Outcome: Completed/Met Goal: Individualized Newborn Educational Video(s) Outcome: Completed/Met   Problem: Activity: Goal: Will verbalize the importance of balancing activity with adequate rest periods Outcome: Completed/Met Goal: Ability to tolerate increased activity will improve Outcome: Completed/Met   Problem: Coping: Goal: Ability to identify and utilize available resources and services will improve Outcome: Completed/Met    Problem: Life Cycle: Goal: Chance of risk for complications during the postpartum period will decrease Outcome: Completed/Met   Problem: Role Relationship: Goal: Ability to demonstrate positive interaction with newborn will improve Outcome: Completed/Met   Problem: Skin Integrity: Goal: Demonstration of wound healing without infection will improve Outcome: Completed/Met

## 2023-02-19 NOTE — Lactation Note (Signed)
This note was copied from a baby's chart. Lactation Consultation Note  Patient Name: [name] FAOZH'Y Date: 02/19/2023 Age:38 hours Reason for consult: Initial assessment;Early term 37-38.6wks;Maternal endocrine disorder;RN request  P6- MOB stated that she is concerned that infant does not want her breasts. According to MOB, infant is sleepy and gaggy. LC reassured MOB that the sleepiness in the first 24 hrs is normal, as well as the gagging due to her fast delivery. Per MOB, she has a history of low supply with all of her children, along with GDM and hypothyroidism. MOB stated that she worked with outpatient LCs and did everything to increase her supply, but nothing worked.  Due to MOB's history, LC encouraged MOB to breastfeed infant first, but follow up with some supplementation after the feeding (as she has been.) LC reviewed feeding infant on cue 8-12x in 24 hrs, not allowing infant to go over 3 hrs without a feeding, CDC milk storage guidelines, LC services handout and engorgement/breast care. LC encouraged  MOB to call lactation team for further assistance.  Maternal Data Does the patient have breastfeeding experience prior to this delivery?: Yes How long did the patient breastfeed?: 3 months for the first 4 children and 1 month for the last child.  Feeding Mother's Current Feeding Choice: Breast Milk and Formula  Lactation Tools Discussed/Used Pump Education: Milk Storage  Interventions Interventions: Breast feeding basics reviewed;Education;LC Services brochure  Discharge Discharge Education: Warning signs for feeding baby;Engorgement and breast care;Outpatient recommendation Pump: DEBP;Personal  Consult Status Consult Status: Follow-up Date: 02/20/23 Follow-up type: In-patient    Dema Severin BS, IBCLC 02/19/2023, 2:01 PM

## 2023-02-19 NOTE — Progress Notes (Signed)
CSW acknowledged consult for car seat; needs assistance with child at home with spina bifida and no one to change diaper while mom hospitalized, and difficulty obtaining emergency medicaid.    When CSW arrived to MOB's bedside in room 512, MOB was resting in bed and infant was asleep in her bassinet.  CSW introduced self and explained need for CSW to complete an assessment.  MOB was receptive to CSW visit and appeared to be happy, easy to engage, and receptive to resources. CSW inquired about the need for infant car seat.  MOB shared that she recently gave her toddler car seat away because she did not anticipate having anymore children.  CSW made MOB aware of the hospital's car seat program and the cost to purchase a car seat.  MOB provided CSW with $30 (cash) and completed necessary documents to obtain car seat; CSW delivered car seat to MOB's bedside.  CSW also provided MOB with a pack n play as MOB communicated not having a safe sleeping area for infant to sleep. CSW reviewed safe sleep and MOB denied having any questions or concerns. CSW delivered a baby bundle to MOB's bedside as MOB expressed having limited items fir infant. MOB expressed gratitude and appeared appreciative of CSW's support.  CSW made MOB aware that a message will be sent to hospital's financial counselor to assist with applying for Medicaid (CSW emailed Peterson M. and requested a follow-up with MOB).   MOB denied needing any assistance with her older daughter that has spina bifida. Per MOB, a family friend is at MOB's home now caring her MOB's daughter.   MOB denied having any other psychosocial stressors and reported having all other essential items to care for infant post discharge.   There are no barriers to discharge.   Blaine Hamper, MSW, LCSW Clinical Social Work [phone]

## 2023-02-19 NOTE — Progress Notes (Signed)
Post Partum Day 0 s/p SVD Subjective: no complaints, up ad lib, voiding, tolerating PO, and + flatus  Objective: Blood pressure (!) 140/83, pulse 61, temperature 97.7 F (36.5 C), temperature source Oral, resp. rate 16, height 5\' 4"  (1.626 m), weight 81.6 kg, last menstrual period 05/31/2022, SpO2 99%, unknown if currently breastfeeding.  Physical Exam:  General: alert, cooperative, and no distress Lochia: appropriate DVT Evaluation: No evidence of DVT seen on physical exam.  Recent Labs    02/18/23 2147 02/19/23 0531  HGB 10.7* 10.6*  HCT 33.1* 33.2*    Assessment/Plan: Postpartum - Contraception: Considering LARC vs interval tubal depending on insurance.  - MOF: Breast - Rh status: Rh pos - Rubella status: Immune - Dispo: Desires early d/c tomorrow. Had hoped for d/c today due to issues with child care for her 31 yo with spina bifida. She was able to arrange care through tomorrow after all.  - Consults: Lactation  Neonatal - Doing well   LOS: 1 day   Milas Hock 02/19/2023, 5:08 PM

## 2023-02-19 NOTE — Discharge Summary (Shared)
Postpartum Discharge Summary  Date of Service updated***     Patient Name: Debra Eaton DOB: 06/17/1985 MRN: [id number]  Date of admission: 02/18/2023 Delivery date:02/19/2023 Delivering provider: Darene Nappi C Date of discharge: 02/19/2023  Admitting diagnosis: Normal labor [O80, Z37.9] Intrauterine pregnancy: [redacted]w[redacted]d     Secondary diagnosis:  Principal Problem:   Normal labor  Additional problems: ***    Discharge diagnosis: Term Pregnancy Delivered                                              Post partum procedures:{Postpartum procedures:23558} Augmentation: AROM and Pitocin Complications: None  Hospital course: Onset of Labor With Vaginal Delivery      37 y.o. yo [id number] at [redacted]w[redacted]d was admitted in Latent Labor on 02/18/2023. Labor course was complicated by***  Membrane Rupture Time/Date: 11:54 PM,02/18/2023  Delivery Method:Vaginal, Spontaneous Operative Delivery:N/A Episiotomy: None Lacerations:  None Patient had a postpartum course complicated by ***.  She is ambulating, tolerating a regular diet, passing flatus, and urinating well. Patient is discharged home in stable condition on 02/19/23.  Newborn Data: Birth date:02/19/2023 Birth time:2:22 AM Gender:Female Living status:Living Apgars:8 ,9  Weight:   Magnesium Sulfate received: {Mag received:30440022} BMZ received: No Rhophylac:N/A MMR:N/A T-DaP:Given prenatally Flu: {DGL:87564} RSV Vaccine received: {RSV:31013} Transfusion:{Transfusion received:30440034}  Immunizations received: Immunization History  Administered Date(s) Administered   Influenza,inj,Quad PF,6+ Mos 06/23/2013, 04/28/2021   Tdap 06/22/2013, 11/29/2022    Physical exam  Vitals:   02/19/23 0200 02/19/23 0215 02/19/23 0230 02/19/23 0245  BP: 106/61 114/69 115/67 118/72  Pulse: (!) 57 61 66 68  Resp:   16   Temp:   98.6 F (37 C)   TempSrc:   Oral   SpO2:      Weight:      Height:       General: {Exam;  general:21111117} Lochia: {Desc; appropriate/inappropriate:30686::"appropriate"} Uterine Fundus: {Desc; firm/soft:30687} Incision: {Exam; incision:21111123} DVT Evaluation: {Exam; dvt:2111122} Labs: Lab Results  Component Value Date   WBC 6.6 02/18/2023   HGB 10.7 (L) 02/18/2023   HCT 33.1 (L) 02/18/2023   MCV 79.0 (L) 02/18/2023   PLT 210 02/18/2023      Latest Ref Rng & Units 04/23/2016    6:10 PM  CMP  Glucose 65 - 99 mg/dL 332   BUN 6 - 20 mg/dL 12   Creatinine 9.51 - 1.00 mg/dL 8.84   Sodium 166 - 063 mmol/L 140   Potassium 3.5 - 5.1 mmol/L 3.9   Chloride 101 - 111 mmol/L 106   CO2 22 - 32 mmol/L 25   Calcium 8.9 - 10.3 mg/dL 9.2    Edinburgh Score:    06/09/2021    3:11 PM  Edinburgh Postnatal Depression Scale Screening Tool  I have been able to laugh and see the funny side of things. 1  I have looked forward with enjoyment to things. 0  I have blamed myself unnecessarily when things went wrong. 0  I have been anxious or worried for no good reason. 2  I have felt scared or panicky for no good reason. 0  Things have been getting on top of me. 0  I have been so unhappy that I have had difficulty sleeping. 0  I have felt sad or miserable. 0  I have been so unhappy that I have been crying. 0  The thought of harming  myself has occurred to me. 0  Edinburgh Postnatal Depression Scale Total 3   No data recorded  After visit meds:  Allergies as of 02/19/2023   No Known Allergies   Med Rec must be completed prior to using this [facility]***        Discharge home in stable condition Infant Feeding: {Baby feeding:23562} Infant Disposition:{CHL IP OB HOME WITH WUJWJX:91478} Discharge instruction: per After Visit Summary and Postpartum booklet. Activity: Advance as tolerated. Pelvic rest for 6 weeks.  Diet: {OB GNFA:21308657} Future Appointments: Future Appointments  Date Time Provider Department Center  02/22/2023 11:15 AM Joanne Gavel, MD [facility] None   03/07/2023  2:00 PM Julieanne Manson, MD [facility] None   Follow up Visit: Sent message to [facility] 10/12  Please schedule this patient for a In person postpartum visit in 4 weeks with the following provider: Any provider. Additional Postpartum F/U:2 hour GTT  High risk pregnancy complicated by: GDM and AMA, hypothyroidism Delivery mode:  Vaginal, Spontaneous Anticipated Birth Control:   BTL vs vasectomy    02/19/2023 Hessie Dibble, MD

## 2023-02-19 NOTE — Anesthesia Postprocedure Evaluation (Signed)
Anesthesia Post Note  Patient: [name]  Procedure(s) Performed: AN AD HOC LABOR EPIDURAL     Patient location during evaluation: Mother Baby Anesthesia Type: Epidural Level of consciousness: awake and alert and oriented Pain management: satisfactory to patient Vital Signs Assessment: post-procedure vital signs reviewed and stable Respiratory status: respiratory function stable Cardiovascular status: stable Postop Assessment: no headache, no backache, epidural receding, patient able to bend at knees, no signs of nausea or vomiting, adequate PO intake and able to ambulate Anesthetic complications: no   No notable events documented.  Last Vitals:  Vitals:   02/19/23 1257 02/19/23 1303  BP: 136/88 (!) 140/83  Pulse: 61   Resp: 16   Temp: 36.5 C   SpO2: 99%     Last Pain:  Vitals:   02/19/23 1257  TempSrc: Oral  PainSc:    Pain Goal:                   Carlos American

## 2023-02-20 MED ORDER — ACETAMINOPHEN 500 MG PO TABS: 500.0000 mg | ORAL_TABLET | Freq: Four times a day (QID) | ORAL | 0 refills | Status: AC | PRN

## 2023-02-20 MED ORDER — IBUPROFEN 600 MG PO TABS: 600.0000 mg | ORAL_TABLET | Freq: Four times a day (QID) | ORAL | 0 refills | Status: AC

## 2023-02-22 ENCOUNTER — Encounter (INDEPENDENT_AMBULATORY_CARE_PROVIDER_SITE_OTHER): Payer: Self-pay | Admitting: Obstetrics and Gynecology

## 2023-03-07 ENCOUNTER — Ambulatory Visit: Payer: Self-pay | Admitting: Internal Medicine

## 2023-03-15 ENCOUNTER — Telehealth (HOSPITAL_COMMUNITY): Payer: Self-pay

## 2023-03-15 NOTE — Telephone Encounter (Signed)
03/15/2023 1628  Name: Dimitria Ketchum MRN: [id number] DOB: [birth year]  Reason for Call:  Transition of Care Hospital Discharge Call  Contact Status: Patient Contact Status: Message  Language assistant needed: Interpreter Mode: Interpreter Not Needed        Follow-Up Questions:    Inocente Salles Postnatal Depression Scale:  In the Past 7 Days:    PHQ2-9 Depression Scale:     Discharge Follow-up:    Post-discharge interventions: NA  Signature  Signe Colt

## 2023-04-04 ENCOUNTER — Ambulatory Visit: Payer: Self-pay | Admitting: Obstetrics & Gynecology

## 2023-04-04 ENCOUNTER — Other Ambulatory Visit: Payer: Self-pay
